# Patient Record
Sex: Female | Born: 1950 | Race: White | Hispanic: No | Marital: Married | Smoking: Never smoker
Health system: Southern US, Community
[De-identification: ages and names within clinical notes are randomized; demographics above are authoritative.]

## PROBLEM LIST (undated history)

## (undated) DIAGNOSIS — K269 Duodenal ulcer, unspecified as acute or chronic, without hemorrhage or perforation: Secondary | ICD-10-CM

## (undated) DIAGNOSIS — T7840XA Allergy, unspecified, initial encounter: Secondary | ICD-10-CM

## (undated) DIAGNOSIS — K5730 Diverticulosis of large intestine without perforation or abscess without bleeding: Secondary | ICD-10-CM

## (undated) DIAGNOSIS — K219 Gastro-esophageal reflux disease without esophagitis: Secondary | ICD-10-CM

## (undated) DIAGNOSIS — K649 Unspecified hemorrhoids: Secondary | ICD-10-CM

## (undated) DIAGNOSIS — E785 Hyperlipidemia, unspecified: Secondary | ICD-10-CM

## (undated) DIAGNOSIS — M1990 Unspecified osteoarthritis, unspecified site: Secondary | ICD-10-CM

## (undated) DIAGNOSIS — I10 Essential (primary) hypertension: Secondary | ICD-10-CM

## (undated) HISTORY — PX: WISDOM TOOTH EXTRACTION: SHX21

## (undated) HISTORY — PX: HEMORRHOID BANDING: SHX5850

## (undated) HISTORY — PX: OTHER SURGICAL HISTORY: SHX169

## (undated) HISTORY — DX: Hyperlipidemia, unspecified: E78.5

## (undated) HISTORY — DX: Allergy, unspecified, initial encounter: T78.40XA

## (undated) HISTORY — DX: Diverticulosis of large intestine without perforation or abscess without bleeding: K57.30

## (undated) HISTORY — PX: UPPER GI ENDOSCOPY: SHX6162

## (undated) HISTORY — PX: COLONOSCOPY: SHX174

## (undated) HISTORY — DX: Essential (primary) hypertension: I10

## (undated) HISTORY — DX: Gastro-esophageal reflux disease without esophagitis: K21.9

## (undated) HISTORY — DX: Duodenal ulcer, unspecified as acute or chronic, without hemorrhage or perforation: K26.9

## (undated) HISTORY — PX: TUBAL LIGATION: SHX77

## (undated) HISTORY — DX: Unspecified osteoarthritis, unspecified site: M19.90

## (undated) HISTORY — DX: Unspecified hemorrhoids: K64.9

---

## 1997-10-24 ENCOUNTER — Other Ambulatory Visit: Admission: RE | Admit: 1997-10-24 | Discharge: 1997-10-24 | Payer: Self-pay | Admitting: Obstetrics and Gynecology

## 1997-10-27 ENCOUNTER — Other Ambulatory Visit: Admission: RE | Admit: 1997-10-27 | Discharge: 1997-10-27 | Payer: Self-pay | Admitting: Obstetrics and Gynecology

## 1998-10-26 ENCOUNTER — Other Ambulatory Visit: Admission: RE | Admit: 1998-10-26 | Discharge: 1998-10-26 | Payer: Self-pay | Admitting: Obstetrics and Gynecology

## 1999-04-26 ENCOUNTER — Other Ambulatory Visit: Admission: RE | Admit: 1999-04-26 | Discharge: 1999-04-26 | Payer: Self-pay | Admitting: Obstetrics and Gynecology

## 1999-04-26 ENCOUNTER — Encounter (INDEPENDENT_AMBULATORY_CARE_PROVIDER_SITE_OTHER): Payer: Self-pay

## 1999-07-26 ENCOUNTER — Encounter: Payer: Self-pay | Admitting: General Surgery

## 1999-07-26 ENCOUNTER — Encounter: Admission: RE | Admit: 1999-07-26 | Discharge: 1999-07-26 | Payer: Self-pay | Admitting: General Surgery

## 1999-11-08 ENCOUNTER — Other Ambulatory Visit: Admission: RE | Admit: 1999-11-08 | Discharge: 1999-11-08 | Payer: Self-pay | Admitting: Obstetrics and Gynecology

## 1999-11-26 ENCOUNTER — Encounter: Admission: RE | Admit: 1999-11-26 | Discharge: 1999-11-26 | Payer: Self-pay | Admitting: Obstetrics and Gynecology

## 1999-11-26 ENCOUNTER — Encounter: Payer: Self-pay | Admitting: Obstetrics and Gynecology

## 2000-11-13 ENCOUNTER — Other Ambulatory Visit: Admission: RE | Admit: 2000-11-13 | Discharge: 2000-11-13 | Payer: Self-pay | Admitting: Obstetrics and Gynecology

## 2000-12-21 ENCOUNTER — Encounter: Payer: Self-pay | Admitting: Obstetrics and Gynecology

## 2000-12-21 ENCOUNTER — Encounter: Admission: RE | Admit: 2000-12-21 | Discharge: 2000-12-21 | Payer: Self-pay | Admitting: Obstetrics and Gynecology

## 2000-12-26 ENCOUNTER — Encounter: Admission: RE | Admit: 2000-12-26 | Discharge: 2000-12-26 | Payer: Self-pay | Admitting: Obstetrics and Gynecology

## 2000-12-26 ENCOUNTER — Encounter: Payer: Self-pay | Admitting: Obstetrics and Gynecology

## 2002-01-02 ENCOUNTER — Encounter: Payer: Self-pay | Admitting: Obstetrics and Gynecology

## 2002-01-02 ENCOUNTER — Encounter: Admission: RE | Admit: 2002-01-02 | Discharge: 2002-01-02 | Payer: Self-pay | Admitting: Obstetrics and Gynecology

## 2003-02-03 ENCOUNTER — Encounter: Admission: RE | Admit: 2003-02-03 | Discharge: 2003-02-03 | Payer: Self-pay | Admitting: Unknown Physician Specialty

## 2003-02-03 ENCOUNTER — Encounter: Payer: Self-pay | Admitting: Unknown Physician Specialty

## 2004-02-06 ENCOUNTER — Encounter: Admission: RE | Admit: 2004-02-06 | Discharge: 2004-02-06 | Payer: Self-pay | Admitting: Obstetrics and Gynecology

## 2004-07-19 ENCOUNTER — Ambulatory Visit: Payer: Self-pay | Admitting: Family Medicine

## 2005-01-17 ENCOUNTER — Ambulatory Visit: Payer: Self-pay | Admitting: Family Medicine

## 2005-02-15 ENCOUNTER — Encounter: Admission: RE | Admit: 2005-02-15 | Discharge: 2005-02-15 | Payer: Self-pay | Admitting: Obstetrics and Gynecology

## 2005-07-18 ENCOUNTER — Ambulatory Visit: Payer: Self-pay | Admitting: Family Medicine

## 2006-01-24 ENCOUNTER — Ambulatory Visit: Payer: Self-pay | Admitting: Family Medicine

## 2006-06-20 ENCOUNTER — Encounter: Admission: RE | Admit: 2006-06-20 | Discharge: 2006-06-20 | Payer: Self-pay | Admitting: Radiology

## 2007-06-21 ENCOUNTER — Encounter: Admission: RE | Admit: 2007-06-21 | Discharge: 2007-06-21 | Payer: Self-pay | Admitting: Obstetrics and Gynecology

## 2008-06-23 ENCOUNTER — Encounter: Admission: RE | Admit: 2008-06-23 | Discharge: 2008-06-23 | Payer: Self-pay | Admitting: Obstetrics and Gynecology

## 2009-06-29 ENCOUNTER — Encounter: Admission: RE | Admit: 2009-06-29 | Discharge: 2009-06-29 | Payer: Self-pay | Admitting: Obstetrics and Gynecology

## 2010-05-26 ENCOUNTER — Other Ambulatory Visit: Payer: Self-pay | Admitting: Obstetrics and Gynecology

## 2010-05-26 DIAGNOSIS — Z1231 Encounter for screening mammogram for malignant neoplasm of breast: Secondary | ICD-10-CM

## 2010-07-05 ENCOUNTER — Ambulatory Visit
Admission: RE | Admit: 2010-07-05 | Discharge: 2010-07-05 | Disposition: A | Discharge status: Home / Self Care | Payer: BC Managed Care – PPO | Source: Ambulatory Visit | Attending: Obstetrics and Gynecology | Admitting: Obstetrics and Gynecology

## 2010-07-05 DIAGNOSIS — Z1231 Encounter for screening mammogram for malignant neoplasm of breast: Secondary | ICD-10-CM

## 2011-05-31 ENCOUNTER — Other Ambulatory Visit: Payer: Self-pay | Admitting: Obstetrics and Gynecology

## 2011-05-31 DIAGNOSIS — Z1231 Encounter for screening mammogram for malignant neoplasm of breast: Secondary | ICD-10-CM

## 2011-07-15 ENCOUNTER — Ambulatory Visit
Admission: RE | Admit: 2011-07-15 | Discharge: 2011-07-15 | Disposition: A | Discharge status: Home / Self Care | Payer: BC Managed Care – PPO | Source: Ambulatory Visit | Attending: Obstetrics and Gynecology | Admitting: Obstetrics and Gynecology

## 2011-07-15 DIAGNOSIS — Z1231 Encounter for screening mammogram for malignant neoplasm of breast: Secondary | ICD-10-CM

## 2012-06-06 ENCOUNTER — Other Ambulatory Visit: Payer: Self-pay | Admitting: Obstetrics and Gynecology

## 2012-07-16 ENCOUNTER — Ambulatory Visit
Admission: RE | Admit: 2012-07-16 | Discharge: 2012-07-16 | Disposition: A | Discharge status: Home / Self Care | Payer: BC Managed Care – PPO | Source: Ambulatory Visit | Attending: Obstetrics and Gynecology | Admitting: Obstetrics and Gynecology

## 2012-07-16 DIAGNOSIS — Z1231 Encounter for screening mammogram for malignant neoplasm of breast: Secondary | ICD-10-CM

## 2013-06-17 ENCOUNTER — Other Ambulatory Visit: Payer: Self-pay

## 2013-06-17 DIAGNOSIS — Z1231 Encounter for screening mammogram for malignant neoplasm of breast: Secondary | ICD-10-CM

## 2013-07-22 ENCOUNTER — Ambulatory Visit
Admission: RE | Admit: 2013-07-22 | Discharge: 2013-07-22 | Disposition: A | Discharge status: Home / Self Care | Payer: BC Managed Care – PPO | Source: Ambulatory Visit

## 2013-07-22 DIAGNOSIS — Z1231 Encounter for screening mammogram for malignant neoplasm of breast: Secondary | ICD-10-CM

## 2014-06-16 ENCOUNTER — Other Ambulatory Visit: Payer: Self-pay

## 2014-06-16 DIAGNOSIS — Z1231 Encounter for screening mammogram for malignant neoplasm of breast: Secondary | ICD-10-CM

## 2014-07-28 ENCOUNTER — Ambulatory Visit: Payer: BC Managed Care – PPO

## 2014-08-25 ENCOUNTER — Encounter (INDEPENDENT_AMBULATORY_CARE_PROVIDER_SITE_OTHER): Payer: Self-pay

## 2014-08-25 ENCOUNTER — Ambulatory Visit
Admission: RE | Admit: 2014-08-25 | Discharge: 2014-08-25 | Disposition: A | Discharge status: Home / Self Care | Payer: BC Managed Care – PPO | Source: Ambulatory Visit

## 2014-08-25 DIAGNOSIS — Z1231 Encounter for screening mammogram for malignant neoplasm of breast: Secondary | ICD-10-CM

## 2015-07-21 ENCOUNTER — Other Ambulatory Visit: Payer: Self-pay

## 2015-07-21 DIAGNOSIS — Z1231 Encounter for screening mammogram for malignant neoplasm of breast: Secondary | ICD-10-CM

## 2015-08-31 ENCOUNTER — Ambulatory Visit
Admission: RE | Admit: 2015-08-31 | Discharge: 2015-08-31 | Disposition: A | Discharge status: Home / Self Care | Payer: Medicare Other | Source: Ambulatory Visit

## 2015-08-31 DIAGNOSIS — Z1231 Encounter for screening mammogram for malignant neoplasm of breast: Secondary | ICD-10-CM

## 2016-07-20 ENCOUNTER — Other Ambulatory Visit: Payer: Self-pay | Admitting: Obstetrics and Gynecology

## 2016-07-20 DIAGNOSIS — Z1231 Encounter for screening mammogram for malignant neoplasm of breast: Secondary | ICD-10-CM

## 2016-09-05 ENCOUNTER — Ambulatory Visit
Admission: RE | Admit: 2016-09-05 | Discharge: 2016-09-05 | Disposition: A | Discharge status: Home / Self Care | Payer: Medicare Other | Source: Ambulatory Visit | Attending: Obstetrics and Gynecology | Admitting: Obstetrics and Gynecology

## 2016-09-05 DIAGNOSIS — Z1231 Encounter for screening mammogram for malignant neoplasm of breast: Secondary | ICD-10-CM

## 2016-10-24 ENCOUNTER — Encounter: Payer: Self-pay | Admitting: Gastroenterology

## 2016-12-07 ENCOUNTER — Ambulatory Visit (AMBULATORY_SURGERY_CENTER): Payer: Self-pay

## 2016-12-07 VITALS — Ht 65.0 in | Wt 156.4 lb

## 2016-12-07 DIAGNOSIS — Z1211 Encounter for screening for malignant neoplasm of colon: Secondary | ICD-10-CM

## 2016-12-07 MED ORDER — NA SULFATE-K SULFATE-MG SULF 17.5-3.13-1.6 GM/177ML PO SOLN: 1.0000 | Freq: Once | ORAL | 0 refills | Status: AC

## 2016-12-07 NOTE — Progress Notes (Signed)
Denies allergies to eggs or soy products. Denies complication of anesthesia or sedation. Denies use of weight loss medication. Denies use of O2.   Emmi instructions declined. No computer. 

## 2016-12-21 ENCOUNTER — Ambulatory Visit (AMBULATORY_SURGERY_CENTER): Payer: Medicare Other | Admitting: Gastroenterology

## 2016-12-21 ENCOUNTER — Encounter: Payer: Self-pay | Admitting: Gastroenterology

## 2016-12-21 VITALS — BP 130/73 | HR 62 | Temp 96.0°F | Resp 11 | Ht 65.0 in | Wt 156.0 lb

## 2016-12-21 DIAGNOSIS — Z1211 Encounter for screening for malignant neoplasm of colon: Secondary | ICD-10-CM

## 2016-12-21 DIAGNOSIS — Z1212 Encounter for screening for malignant neoplasm of rectum: Secondary | ICD-10-CM | POA: Diagnosis not present

## 2016-12-21 MED ORDER — SODIUM CHLORIDE 0.9 % IV SOLN: 500.0000 mL | INTRAVENOUS | Status: DC

## 2016-12-21 NOTE — Progress Notes (Signed)
A and O x3. Report to RN. Tolerated MAC anesthesia well.

## 2016-12-21 NOTE — Patient Instructions (Signed)
Discharge instructions given. Handouts on diverticulosis and hemorrhoids. Resume previous medications. YOU HAD AN ENDOSCOPIC PROCEDURE TODAY AT THE [facility]:   Refer to the procedure report that was given to you for any specific questions about what was found during the examination.  If the procedure report does not answer your questions, please call your gastroenterologist to clarify.  If you requested that your care partner not be given the details of your procedure findings, then the procedure report has been included in a sealed envelope for you to review at your convenience later.  YOU SHOULD EXPECT: Some feelings of bloating in the abdomen. Passage of more gas than usual.  Walking can help get rid of the air that was put into your GI tract during the procedure and reduce the bloating. If you had a lower endoscopy (such as a colonoscopy or flexible sigmoidoscopy) you may notice spotting of blood in your stool or on the toilet paper. If you underwent a bowel prep for your procedure, you may not have a normal bowel movement for a few days.  Please Note:  You might notice some irritation and congestion in your nose or some drainage.  This is from the oxygen used during your procedure.  There is no need for concern and it should clear up in a day or so.  SYMPTOMS TO REPORT IMMEDIATELY:   Following lower endoscopy (colonoscopy or flexible sigmoidoscopy):  Excessive amounts of blood in the stool  Significant tenderness or worsening of abdominal pains  Swelling of the abdomen that is new, acute  Fever of 100F or higher   For urgent or emergent issues, a gastroenterologist can be reached at any hour by calling [phone].   DIET:  We do recommend a small meal at first, but then you may proceed to your regular diet.  Drink plenty of fluids but you should avoid alcoholic beverages for 24 hours.  ACTIVITY:  You should plan to take it easy for the rest of today and you should  NOT DRIVE or use heavy machinery until tomorrow (because of the sedation medicines used during the test).    FOLLOW UP: Our staff will call the number listed on your records the next business day following your procedure to check on you and address any questions or concerns that you may have regarding the information given to you following your procedure. If we do not reach you, we will leave a message.  However, if you are feeling well and you are not experiencing any problems, there is no need to return our call.  We will assume that you have returned to your regular daily activities without incident.  If any biopsies were taken you will be contacted by phone or by letter within the next 1-3 weeks.  Please call us at [phone] if you have not heard about the biopsies in 3 weeks.    SIGNATURES/CONFIDENTIALITY: You and/or your care partner have signed paperwork which will be entered into your electronic medical record.  These signatures attest to the fact that that the information above on your After Visit Summary has been reviewed and is understood.  Full responsibility of the confidentiality of this discharge information lies with you and/or your care-partner. 

## 2016-12-21 NOTE — Op Note (Signed)
[facility] Patient Name: Gloria Moore Procedure Date: 12/21/2016 10:24 AM MRN: [id number] Endoscopist: Viviann SpareSteven P. Natacha Jepsen MD, MD Age: [age] Referring MD:  Date of Birth: 26-Oct-1950 Gender: Female Account #: [id number] Procedure:                Colonoscopy Indications:              Screening for colorectal malignant neoplasm Medicines:                Monitored Anesthesia Care Procedure:                Pre-Anesthesia Assessment:                           - Prior to the procedure, a History and Physical                            was performed, and patient medications and                            allergies were reviewed. The patient's tolerance of                            previous anesthesia was also reviewed. The risks                            and benefits of the procedure and the sedation                            options and risks were discussed with the patient.                            All questions were answered, and informed consent                            was obtained. Prior Anticoagulants: The patient has                            taken no previous anticoagulant or antiplatelet                            agents. ASA Grade Assessment: II - A patient with                            mild systemic disease. After reviewing the risks                            and benefits, the patient was deemed in                            satisfactory condition to undergo the procedure.                           After obtaining informed consent, the colonoscope  was passed under direct vision. Throughout the                            procedure, the patient's blood pressure, pulse, and                            oxygen saturations were monitored continuously. The                            Colonoscope was introduced through the anus and                            advanced to the the cecum, identified by                            appendiceal orifice  and ileocecal valve. The                            colonoscopy was performed without difficulty. The                            patient tolerated the procedure well. The quality                            of the bowel preparation was good. The ileocecal                            valve, appendiceal orifice, and rectum were                            photographed. Scope In: 10:32:40 AM Scope Out: 10:53:05 AM Scope Withdrawal Time: 0 hours 17 minutes 27 seconds  Total Procedure Duration: 0 hours 20 minutes 25 seconds  Findings:                 The perianal and digital rectal examinations were                            normal other than small external hemorrhoids                           A few medium-mouthed diverticula were found in the                            sigmoid colon.                           Internal hemorrhoids were found during retroflexion.                           The hepatic flexure was quite angulated with                            difficult visualization, although multiple passes  made with the colonoscope in this area and no                            abnormalities noted. The exam was otherwise without                            abnormality. Complications:            No immediate complications. Estimated blood loss:                            None. Estimated Blood Loss:     Estimated blood loss: none. Impression:               - Small external hemorrhoids                           - Diverticulosis in the sigmoid colon.                           - Internal hemorrhoids.                           - The examination was otherwise normal.                           - No specimens collected. Recommendation:           - Patient has a contact number available for                            emergencies. The signs and symptoms of potential                            delayed complications were discussed with the                            patient.  Return to normal activities tomorrow.                            Written discharge instructions were provided to the                            patient.                           - Resume previous diet.                           - Continue present medications.                           - Repeat colonoscopy in 10 years for screening                            purposes if you still wish to have screening at  that time, depending on multiple factors. You can                            see me in clinic at that time to determine if you                            wish to have further screening Sharronda Schweers P. Exander Shaul MD, MD 12/21/2016 10:58:22 AM This report has been signed electronically.

## 2016-12-22 ENCOUNTER — Telehealth: Payer: Self-pay | Admitting: Gastroenterology

## 2016-12-22 ENCOUNTER — Telehealth: Payer: Self-pay

## 2016-12-22 NOTE — Telephone Encounter (Signed)
  Follow up Call-  Call back number 12/21/2016  Post procedure Call Back phone  # [phone]  Permission to leave phone message Yes  Some recent data might be hidden     Patient questions:  Do you have a fever, pain , or abdominal swelling? No. Pain Score  0 *  Have you tolerated food without any problems? Yes.    Have you been able to return to your normal activities? Yes.    Do you have any questions about your discharge instructions: Diet   No. Medications  No. Follow up visit  No.  Do you have questions or concerns about your Care? No.  Actions: * If pain score is 4 or above: No action needed, pain <4.

## 2016-12-22 NOTE — Telephone Encounter (Signed)
Pt scheduled for banding #1 12/28/16@4pm . Pt aware of appt.

## 2016-12-22 NOTE — Telephone Encounter (Signed)
Dr. Adela LankArmbruster please advise if ok to schedule pt for Hem Banding.

## 2016-12-22 NOTE — Telephone Encounter (Signed)
Yes if she is having symptoms from hemorrhoids, noted on colonoscopy yesterday, we can schedule her for a routine banding. Thanks

## 2016-12-28 ENCOUNTER — Ambulatory Visit (INDEPENDENT_AMBULATORY_CARE_PROVIDER_SITE_OTHER): Payer: Medicare Other | Admitting: Gastroenterology

## 2016-12-28 ENCOUNTER — Encounter: Payer: Self-pay | Admitting: Gastroenterology

## 2016-12-28 VITALS — BP 124/76 | HR 60 | Ht 65.0 in | Wt 157.8 lb

## 2016-12-28 DIAGNOSIS — K648 Other hemorrhoids: Secondary | ICD-10-CM

## 2016-12-28 NOTE — Progress Notes (Signed)
Patient here for band ligation of hemorrhoids. Main symptoms are irritation, rare bleeding. She wishes to proceed with banding following discussion of risks / benefits.     PROCEDURE NOTE: The patient presents with symptomatic grade II  hemorrhoids, requesting rubber band ligation of his/her hemorrhoidal disease.  All risks, benefits and alternative forms of therapy were described and informed consent was obtained.  In the Left Lateral Decubitus position anoscopic examination revealed grade II hemorrhoids in all position(s).  The anorectum was pre-medicated with 0.125% nitroglycerin The decision was made to band the RP internal hemorrhoid, and the [facility] O'Regan System was used to perform band ligation without complication.  Digital anorectal examination was then performed to assure proper positioning of the band, and to adjust the banded tissue as required.  The patient was discharged home without pain or other issues.  Dietary and behavioral recommendations were given and along with follow-up instructions.      The patient will return in 2-4 weeks for  follow-up and possible additional banding as required. No complications were encountered and the patient tolerated the procedure well.  Gloria PatrickSteven Karlisa Gaubert, MD [facility] Pager [phone]

## 2016-12-28 NOTE — Patient Instructions (Addendum)
HEMORRHOID BANDING PROCEDURE    FOLLOW-UP CARE   1. The procedure you have had should have been relatively painless since the banding of the area involved does not have nerve endings and there is no pain sensation.  The rubber band cuts off the blood supply to the hemorrhoid and the band may fall off as soon as 48 hours after the banding (the band may occasionally be seen in the toilet bowl following a bowel movement). You may notice a temporary feeling of fullness in the rectum which should respond adequately to plain Tylenol or Motrin.  2. Following the banding, avoid strenuous exercise that evening and resume full activity the next day.  A sitz bath (soaking in a warm tub) or bidet is soothing, and can be useful for cleansing the area after bowel movements.     3. To avoid constipation, take two tablespoons of natural wheat bran, natural oat bran, flax, Benefiber or any over the counter fiber supplement and increase your water intake to 7-8 glasses daily.    4. Unless you have been prescribed anorectal medication, do not put anything inside your rectum for two weeks: No suppositories, enemas, fingers, etc.  5. Occasionally, you may have more bleeding than usual after the banding procedure.  This is often from the untreated hemorrhoids rather than the treated one.  Don't be concerned if there is a tablespoon or so of blood.  If there is more blood than this, lie flat with your bottom higher than your head and apply an ice pack to the area. If the bleeding does not stop within a half an hour or if you feel faint, call our office at [phone] or go to the emergency room.  6. Problems are not common; however, if there is a substantial amount of bleeding, severe pain, chills, fever or difficulty passing urine (very rare) or other problems, you should call us at [phone] or report to the nearest emergency room.  7. Do not stay seated continuously for more than 2-3 hours for a day or two  after the procedure.  Tighten your buttock muscles 10-15 times every two hours and take 10-15 deep breaths every 1-2 hours.  Do not spend more than a few minutes on the toilet if you cannot empty your bowel; instead re-visit the toilet at a later time.   We have scheduled a 2nd banding appointment for 01-23-17 at 4:00pm.  We have scheduled a 3rd banding appointment for 02-14-17 at 4:00pm.  If you are age 68 or older, your body mass index should be between 23-30. Your Body mass index is 26.26 kg/m. If this is out of the aforementioned range listed, please consider follow up with your Primary Care Provider.  If you are age 68 or younger, your body mass index should be between 19-25. Your Body mass index is 26.26 kg/m. If this is out of the aformentioned range listed, please consider follow up with your Primary Care Provider.

## 2017-01-23 ENCOUNTER — Encounter (INDEPENDENT_AMBULATORY_CARE_PROVIDER_SITE_OTHER): Payer: Self-pay

## 2017-01-23 ENCOUNTER — Ambulatory Visit (INDEPENDENT_AMBULATORY_CARE_PROVIDER_SITE_OTHER): Payer: Medicare Other | Admitting: Gastroenterology

## 2017-01-23 ENCOUNTER — Encounter: Payer: Self-pay | Admitting: Gastroenterology

## 2017-01-23 VITALS — BP 130/74 | HR 60 | Ht 65.0 in | Wt 156.0 lb

## 2017-01-23 DIAGNOSIS — K641 Second degree hemorrhoids: Secondary | ICD-10-CM | POA: Diagnosis not present

## 2017-01-23 DIAGNOSIS — K648 Other hemorrhoids: Secondary | ICD-10-CM

## 2017-01-23 NOTE — Patient Instructions (Signed)
If you are age 68 or older, your body mass index should be between 23-30. Your Body mass index is 25.96 kg/m. If this is out of the aforementioned range listed, please consider follow up with your Primary Care Provider.  If you are age 47 or younger, your body mass index should be between 19-25. Your Body mass index is 25.96 kg/m. If this is out of the aformentioned range listed, please consider follow up with your Primary Care Provider.   HEMORRHOID BANDING PROCEDURE    FOLLOW-UP CARE   1. The procedure you have had should have been relatively painless since the banding of the area involved does not have nerve endings and there is no pain sensation.  The rubber band cuts off the blood supply to the hemorrhoid and the band may fall off as soon as 48 hours after the banding (the band may occasionally be seen in the toilet bowl following a bowel movement). You may notice a temporary feeling of fullness in the rectum which should respond adequately to plain Tylenol or Motrin.  2. Following the banding, avoid strenuous exercise that evening and resume full activity the next day.  A sitz bath (soaking in a warm tub) or bidet is soothing, and can be useful for cleansing the area after bowel movements.     3. To avoid constipation, take two tablespoons of natural wheat bran, natural oat bran, flax, Benefiber or any over the counter fiber supplement and increase your water intake to 7-8 glasses daily.    4. Unless you have been prescribed anorectal medication, do not put anything inside your rectum for two weeks: No suppositories, enemas, fingers, etc.  5. Occasionally, you may have more bleeding than usual after the banding procedure.  This is often from the untreated hemorrhoids rather than the treated one.  Don't be concerned if there is a tablespoon or so of blood.  If there is more blood than this, lie flat with your bottom higher than your head and apply an ice pack to the area. If the bleeding  does not stop within a half an hour or if you feel faint, call our office at [phone] or go to the emergency room.  6. Problems are not common; however, if there is a substantial amount of bleeding, severe pain, chills, fever or difficulty passing urine (very rare) or other problems, you should call us at [phone] or report to the nearest emergency room.  7. Do not stay seated continuously for more than 2-3 hours for a day or two after the procedure.  Tighten your buttock muscles 10-15 times every two hours and take 10-15 deep breaths every 1-2 hours.  Do not spend more than a few minutes on the toilet if you cannot empty your bowel; instead re-visit the toilet at a later time.   Follow up appointment with Dr. Adela Lank on 02/14/17 at 4:00 pm.  Thank you for choosing me and [facility].   Ileene Patrick, MD

## 2017-01-23 NOTE — Progress Notes (Signed)
PAtient here for follow up. She did well with the first banding, she has had improvement in her symptoms. Here for second banding today.    PROCEDURE NOTE: The patient presents with symptomatic grade II  hemorrhoids, requesting rubber band ligation of his/her hemorrhoidal disease.  All risks, benefits and alternative forms of therapy were described and informed consent was obtained.  The anorectum was pre-medicated with 0.125% nitroglycerin The decision was made to band the LL internal hemorrhoid, and the [facility] O'Regan System was used to perform band ligation without complication.  Digital anorectal examination was then performed to assure proper positioning of the band, and to adjust the banded tissue as required.  The patient was discharged home without pain or other issues.  Dietary and behavioral recommendations were given and along with follow-up instructions.     The patient will return in 2-4 weeks for  follow-up and possible additional banding as required. No complications were encountered and the patient tolerated the procedure well.  Gloria Patrick, MD [facility] Pager [phone]

## 2017-02-14 ENCOUNTER — Ambulatory Visit (INDEPENDENT_AMBULATORY_CARE_PROVIDER_SITE_OTHER): Payer: Medicare Other | Admitting: Gastroenterology

## 2017-02-14 ENCOUNTER — Encounter: Payer: Self-pay | Admitting: Gastroenterology

## 2017-02-14 VITALS — BP 120/78 | HR 72 | Ht 66.0 in | Wt 156.0 lb

## 2017-02-14 DIAGNOSIS — K648 Other hemorrhoids: Secondary | ICD-10-CM | POA: Diagnosis not present

## 2017-02-14 NOTE — Progress Notes (Signed)
Patient has had a nice response to her first 2 hemorrhoid bandings. Symptoms have resolved. She is here for her final banding treatment.   PROCEDURE NOTE: The patient presents with symptomatic grade II  hemorrhoids, requesting rubber band ligation of his/her hemorrhoidal disease.  All risks, benefits and alternative forms of therapy were described and informed consent was obtained.  The anorectum was pre-medicated with 0.125% nitroglycerin The decision was made to band the RA internal hemorrhoid, and the [facility] O'Regan System was used to perform band ligation without complication.  Digital anorectal examination was then performed to assure proper positioning of the band, and to adjust the banded tissue as required.  The patient was discharged home without pain or other issues.  Dietary and behavioral recommendations were given and along with follow-up instructions.     The patient will follow up as needed moving forward No complications were encountered and the patient tolerated the procedure well.  Ileene PatrickSteven Leshia Kope, MD [facility] Pager [phone]

## 2017-02-14 NOTE — Patient Instructions (Addendum)
HEMORRHOID BANDING PROCEDURE    FOLLOW-UP CARE   1. The procedure you have had should have been relatively painless since the banding of the area involved does not have nerve endings and there is no pain sensation.  The rubber band cuts off the blood supply to the hemorrhoid and the band may fall off as soon as 48 hours after the banding (the band may occasionally be seen in the toilet bowl following a bowel movement). You may notice a temporary feeling of fullness in the rectum which should respond adequately to plain Tylenol or Motrin.  2. Following the banding, avoid strenuous exercise that evening and resume full activity the next day.  A sitz bath (soaking in a warm tub) or bidet is soothing, and can be useful for cleansing the area after bowel movements.     3. To avoid constipation, take two tablespoons of natural wheat bran, natural oat bran, flax, Benefiber or any over the counter fiber supplement and increase your water intake to 7-8 glasses daily.    4. Unless you have been prescribed anorectal medication, do not put anything inside your rectum for two weeks: No suppositories, enemas, fingers, etc.  5. Occasionally, you may have more bleeding than usual after the banding procedure.  This is often from the untreated hemorrhoids rather than the treated one.  Don't be concerned if there is a tablespoon or so of blood.  If there is more blood than this, lie flat with your bottom higher than your head and apply an ice pack to the area. If the bleeding does not stop within a half an hour or if you feel faint, call our office at [phone] or go to the emergency room.  6. Problems are not common; however, if there is a substantial amount of bleeding, severe pain, chills, fever or difficulty passing urine (very rare) or other problems, you should call us at [phone] or report to the nearest emergency room.  7. Do not stay seated continuously for more than 2-3 hours for a day or two  after the procedure.  Tighten your buttock muscles 10-15 times every two hours and take 10-15 deep breaths every 1-2 hours.  Do not spend more than a few minutes on the toilet if you cannot empty your bowel; instead re-visit the toilet at a later time.  Follow up as needed with Dr Adela LankArmbruster.  I appreciate the opportunity to care for you.

## 2017-07-27 ENCOUNTER — Other Ambulatory Visit: Payer: Self-pay | Admitting: Obstetrics and Gynecology

## 2017-07-27 DIAGNOSIS — Z1231 Encounter for screening mammogram for malignant neoplasm of breast: Secondary | ICD-10-CM

## 2017-09-18 ENCOUNTER — Ambulatory Visit
Admission: RE | Admit: 2017-09-18 | Discharge: 2017-09-18 | Disposition: A | Discharge status: Home / Self Care | Payer: Medicare Other | Source: Ambulatory Visit | Attending: Obstetrics and Gynecology | Admitting: Obstetrics and Gynecology

## 2017-09-18 DIAGNOSIS — Z1231 Encounter for screening mammogram for malignant neoplasm of breast: Secondary | ICD-10-CM

## 2018-08-31 IMAGING — MG DIGITAL SCREENING BILATERAL MAMMOGRAM WITH CAD
5 series · 5 of 5 positions shown · non-contrast
Comparison: Previous exam(s).

CLINICAL DATA: Screening.

EXAM:
DIGITAL SCREENING BILATERAL MAMMOGRAM WITH CAD

[R MLO (1 of 2)]
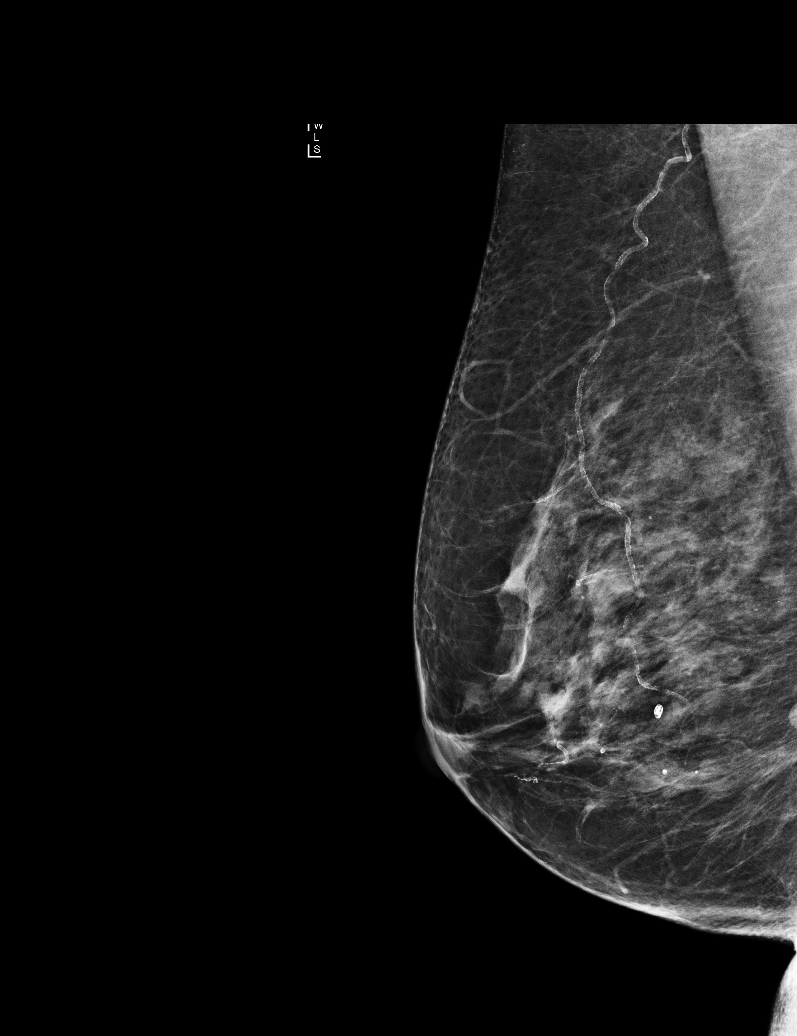

[L CC]
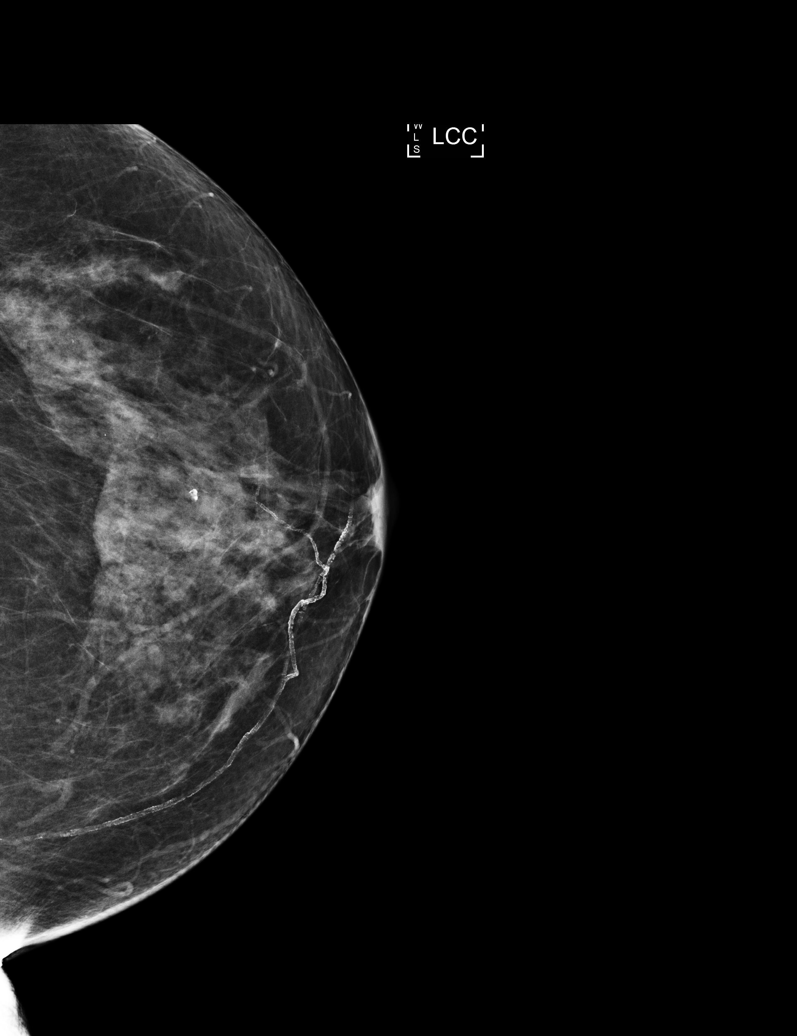

[L MLO]
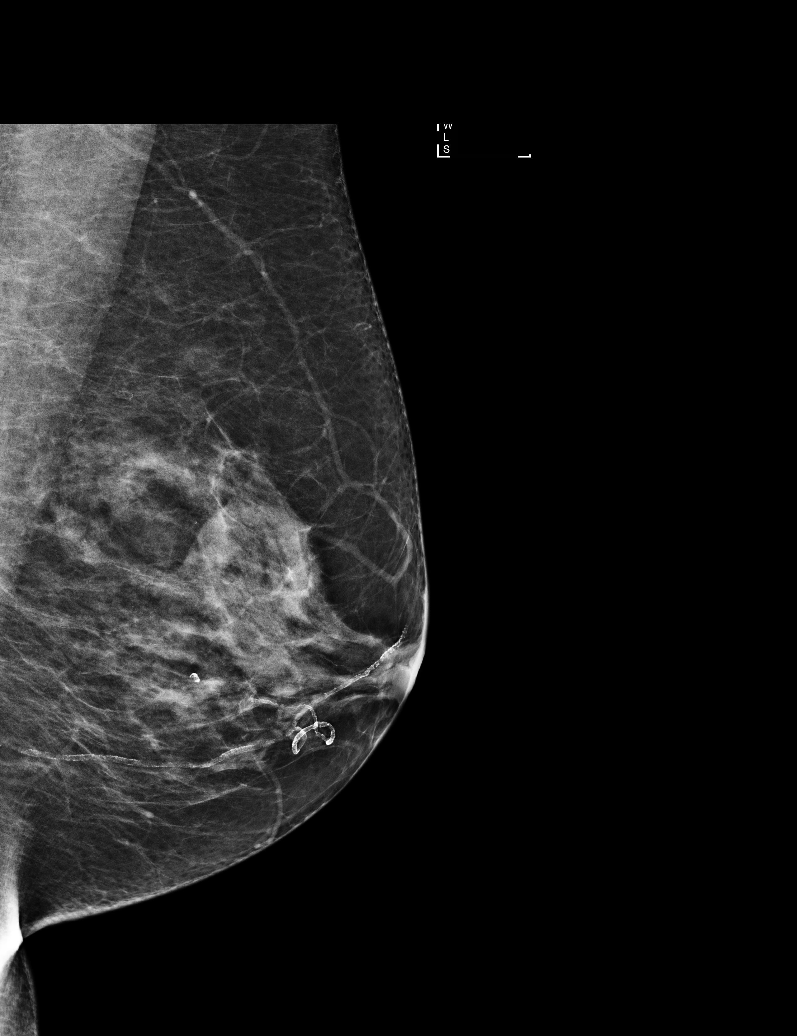

[R MLO (2 of 2)]
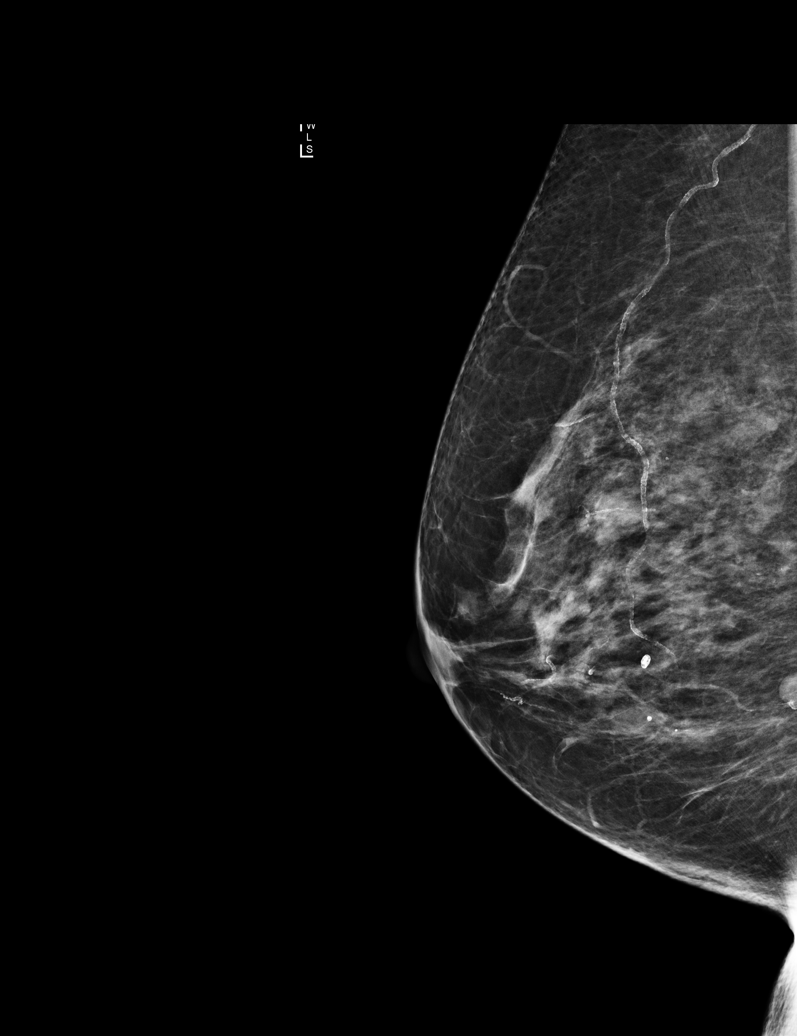

[R CC]
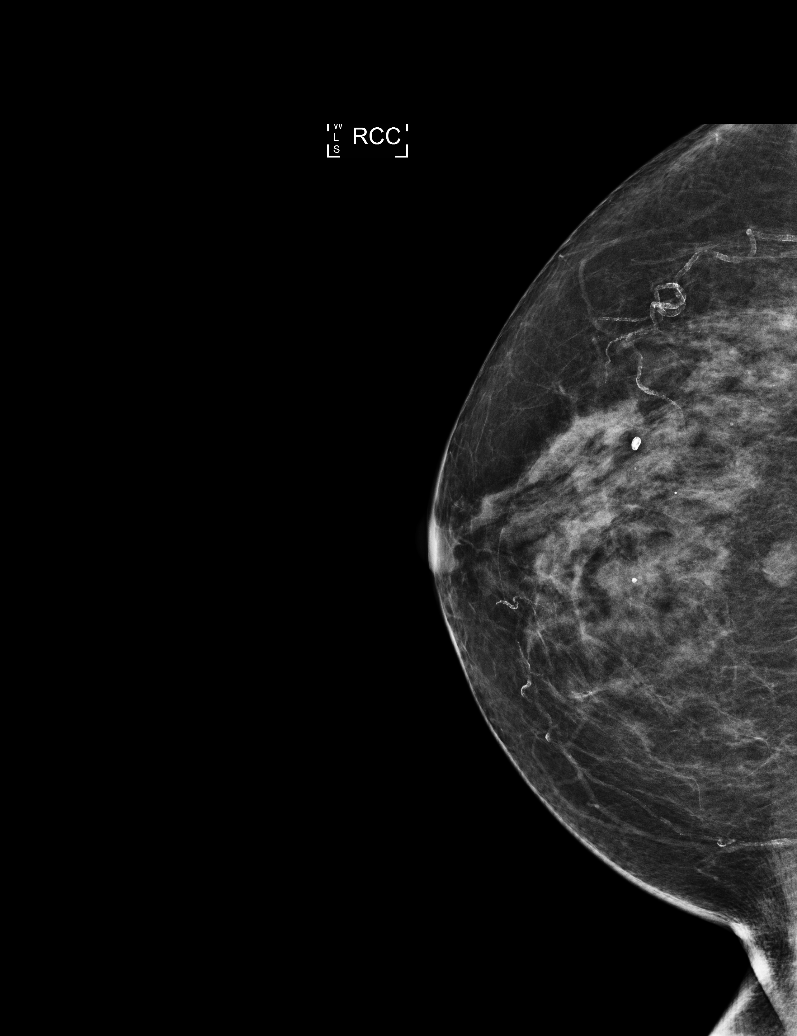

[5 of 5 positions shown; findings below may reference images not displayed]

ACR Breast Density Category c: The breast tissue is heterogeneously
dense, which may obscure small masses.
FINDINGS: There are no findings suspicious for malignancy. Images were
processed with CAD.
IMPRESSION: No mammographic evidence of malignancy. A result letter of this
screening mammogram will be mailed directly to the patient.

RECOMMENDATION:
Screening mammogram in one year. (Code:YJ-[id number])

BI-RADS CATEGORY  1: Negative.

## 2018-09-17 ENCOUNTER — Other Ambulatory Visit: Payer: Self-pay | Admitting: Obstetrics and Gynecology

## 2018-09-17 DIAGNOSIS — Z1231 Encounter for screening mammogram for malignant neoplasm of breast: Secondary | ICD-10-CM

## 2018-10-06 ENCOUNTER — Other Ambulatory Visit: Payer: Self-pay

## 2018-10-06 ENCOUNTER — Ambulatory Visit
Admission: RE | Admit: 2018-10-06 | Discharge: 2018-10-06 | Disposition: A | Discharge status: Home / Self Care | Payer: Medicare Other | Source: Ambulatory Visit | Attending: Obstetrics and Gynecology | Admitting: Obstetrics and Gynecology

## 2018-10-06 DIAGNOSIS — Z1231 Encounter for screening mammogram for malignant neoplasm of breast: Secondary | ICD-10-CM

## 2019-05-20 ENCOUNTER — Telehealth: Payer: Self-pay | Admitting: Gastroenterology

## 2019-05-20 NOTE — Telephone Encounter (Signed)
Called patient back and she states she has been taking Imodium usually twice a week and Tums-2 tabs daily. She wanted to know if this is ok to continue until her office appt. On 06/13/19. I told her Yes.

## 2019-05-20 NOTE — Telephone Encounter (Signed)
Patient is calling wanting to know if it's ok for her to continue with Imodium until her appt. Scheduled for 06/13/19.

## 2019-06-13 ENCOUNTER — Ambulatory Visit (INDEPENDENT_AMBULATORY_CARE_PROVIDER_SITE_OTHER): Payer: Medicare Other | Admitting: Gastroenterology

## 2019-06-13 ENCOUNTER — Other Ambulatory Visit: Payer: Self-pay

## 2019-06-13 ENCOUNTER — Encounter: Payer: Self-pay | Admitting: Gastroenterology

## 2019-06-13 VITALS — BP 120/70 | HR 75 | Temp 98.7°F | Ht 67.0 in | Wt 146.0 lb

## 2019-06-13 DIAGNOSIS — R634 Abnormal weight loss: Secondary | ICD-10-CM

## 2019-06-13 DIAGNOSIS — K219 Gastro-esophageal reflux disease without esophagitis: Secondary | ICD-10-CM

## 2019-06-13 DIAGNOSIS — Z01818 Encounter for other preprocedural examination: Secondary | ICD-10-CM

## 2019-06-13 DIAGNOSIS — R1013 Epigastric pain: Secondary | ICD-10-CM

## 2019-06-13 DIAGNOSIS — Z791 Long term (current) use of non-steroidal anti-inflammatories (NSAID): Secondary | ICD-10-CM | POA: Diagnosis not present

## 2019-06-13 MED ORDER — OMEPRAZOLE 40 MG PO CPDR: 40.0000 mg | DELAYED_RELEASE_CAPSULE | Freq: Every day | ORAL | 3 refills | Status: DC

## 2019-06-13 NOTE — Progress Notes (Signed)
HPI :  69 year old female known to me for prior history of screening colonoscopy and hemorrhoid banding, here for a follow-up visit for abdominal pain and reflux.    She states since January she has been having epigastric discomfort.  She describes it as a burning pain that can be severe at times.  It came on quickly for her and then has essentially persisted since that time.  She has also noticed worsening reflux symptoms and heartburn during this time as well.  She tends to eat okay, in fact eating tends to make her pain a little bit less.  If she fasts the pain seems to get worse.  She has been using Tums intermittently, this definitely helps take away some of her symptoms but very transient.  She is also had hiccups and belching that bothers her.  She is lost about 3 to 5 pounds since symptoms began.  She does take a baby aspirin on top of this takes meloxicam 15 mg once daily for chronic arthritic pain.  She states she has been on this regimen for a few years.  No prior history of gastric ulcers.  She has no family history of gastric or esophageal cancer.  She has not been tried on any PPIs.  She denies any blood in her stools.  No history of melena.  She is quite concerned about underlying pathology causing this.  Underwent hemorrhoid banding in 2018 x 3.  She states this is worked really well and has had no further hemorrhoid symptoms that have bothered her.  Colonoscopy 12/21/2016 -  - A few medium-mouthed diverticula were found in the sigmoid colon. - Internal hemorrhoids were found during retroflexion. - The hepatic flexure was quite angulated with difficult visualization, although multiple passes made with the colonoscope in this area and no abnormalities noted. The exam was otherwise without abnormality.   Past Medical History:  Diagnosis Date  . Allergy   . Arthritis   . Diverticula of colon   . GERD (gastroesophageal reflux disease)   . Hemorrhoids    internal and external  .  Hyperlipidemia   . Hypertension      Past Surgical History:  Procedure Laterality Date  . Bladder Stretched    . TUBAL LIGATION Bilateral   . WISDOM TOOTH EXTRACTION     Family History  Problem Relation Age of Onset  . Dementia Mother   . Heart disease Father   . Other Sister        gallbladder cancer  . Colon polyps Neg Hx   . Esophageal cancer Neg Hx   . Pancreatic cancer Neg Hx   . Rectal cancer Neg Hx   . Stomach cancer Neg Hx    Social History   Tobacco Use  . Smoking status: Never Smoker  . Smokeless tobacco: Never Used  Substance Use Topics  . Alcohol use: No  . Drug use: No   Current Outpatient Medications  Medication Sig Dispense Refill  . aspirin EC 81 MG tablet Take 81 mg by mouth daily.    Marland Kitchen atorvastatin (LIPITOR) 10 MG tablet Take 5 mg by mouth daily. One ten mg tablet cut in half taken daily.     . cetirizine (ZYRTEC) 10 MG tablet Take 10 mg by mouth daily.    . Coenzyme Q10 (COQ-10) 100 MG CAPS Take 300 mg by mouth daily.    Marland Kitchen EPINEPHrine 0.3 mg/0.3 mL IJ SOAJ injection Inject 1 mg into the muscle once.    Marland Kitchen  hydrochlorothiazide (HYDRODIURIL) 25 MG tablet Take 25 mg by mouth daily.    . meloxicam (MOBIC) 7.5 MG tablet Take 7.5 mg by mouth daily.    . montelukast (SINGULAIR) 10 MG tablet Take 10 mg by mouth at bedtime.    Marland Kitchen OVER THE COUNTER MEDICATION Calcium and Vitamin D 600 mg two times daily.    . Probiotic Product (PROBIOTIC-10 ULTIMATE PO) Take by mouth.    . triamcinolone (NASACORT ALLERGY 24HR CHILDREN) 55 MCG/ACT AERO nasal inhaler Place 2 sprays into the nose daily.    . metoprolol succinate (TOPROL-XL) 25 MG 24 hr tablet Take 25 mg by mouth daily.     Current Facility-Administered Medications  Medication Dose Route Frequency Provider Last Rate Last Admin  . 0.9 %  sodium chloride infusion  500 mL Intravenous Continuous Daeshon Grammatico, Willaim Rayas, MD       Allergies  Allergen Reactions  . Bee Venom Hives  . Penicillins Rash  . Sulfa Antibiotics  Rash     Review of Systems: All systems reviewed and negative except where noted in HPI.    No results found.  Physical Exam: BP 120/70   Pulse 75   Temp 98.7 F (37.1 C)   Ht 5\' 7"  (1.702 m)   Wt 146 lb (66.2 kg)   BMI 22.87 kg/m  Constitutional: Pleasant,well-developed, female in no acute distress. HEENT: Normocephalic and atraumatic. Conjunctivae are normal. No scleral icterus. Neck supple.  Cardiovascular: Normal rate, regular rhythm.  Pulmonary/chest: Effort normal and breath sounds normal. No wheezing, rales or rhonchi. Abdominal: Soft, nondistended, nontender. . There are no masses palpable. No hepatomegaly. Extremities: no edema Lymphadenopathy: No cervical adenopathy noted. Neurological: Alert and oriented to person place and time. Skin: Skin is warm and dry. No rashes noted. Psychiatric: Normal mood and affect. Behavior is normal.   ASSESSMENT AND PLAN: 69 year old female here for reassessment of the following issues:  Epigastric pain / GERD / weight loss / NSAID use - history as outlined above, high on differential is peptic ulcer disease in the setting of NSAIDs, while reflux esophagitis also possible.  Given the symptoms and her weight loss I am recommending an upper endoscopy to further evaluate confirm/clarify the diagnosis, ensure no mass lesions in the stomach etc. given her age.  I discussed risk and benefits of endoscopy and anesthesia with her and following this discussion she wanted to proceed.  In the interim I recommend that she hold off on the Mobic and her aspirin right now until this is further clarified, she takes aspirin for preventative purposes and denies any history of heart attack or stroke.  She can use Tylenol as needed for her joint pains in the interim.  Otherwise in the interim recommend she start omeprazole 40 mg once a day, and can continue Tums as needed if that helps.  EGD is scheduled within the next week, further recommendations pending  the results.  She agreed with the plan.   I spent 30 minutes of time, including in depth chart review, independent review of results as outlined above, communicating results with the patient directly, face-to-face time with the patient, coordinating care, and ordering studies and medications as appropriate, and documenting this encounter.   64, MD [facility]

## 2019-06-13 NOTE — Patient Instructions (Addendum)
If you are age 71 or older, your body mass index should be between 23-30. Your Body mass index is 22.87 kg/m. If this is out of the aforementioned range listed, please consider follow up with your Primary Care Provider.  If you are age 84 or younger, your body mass index should be between 19-25. Your Body mass index is 22.87 kg/m. If this is out of the aformentioned range listed, please consider follow up with your Primary Care Provider.   We have sent the following medications to your pharmacy for you to pick up at your convenience: Omeprazole 40mg : Take once daily  Stop taking mobic and aspirin.  Use Tylenol as needed.  Thank you for entrusting me with your care and for choosing [facility], Dr. [facility]

## 2019-06-14 ENCOUNTER — Other Ambulatory Visit: Payer: Self-pay

## 2019-06-14 ENCOUNTER — Ambulatory Visit (INDEPENDENT_AMBULATORY_CARE_PROVIDER_SITE_OTHER): Payer: Medicare Other

## 2019-06-14 DIAGNOSIS — Z1159 Encounter for screening for other viral diseases: Secondary | ICD-10-CM

## 2019-06-15 LAB — SARS CORONAVIRUS 2 (TAT 6-24 HRS): SARS Coronavirus 2: NEGATIVE

## 2019-06-18 ENCOUNTER — Encounter: Payer: Self-pay | Admitting: Gastroenterology

## 2019-06-18 ENCOUNTER — Ambulatory Visit (AMBULATORY_SURGERY_CENTER): Payer: Medicare Other | Admitting: Gastroenterology

## 2019-06-18 ENCOUNTER — Other Ambulatory Visit: Payer: Self-pay

## 2019-06-18 VITALS — BP 109/61 | HR 68 | Temp 97.7°F | Resp 11 | Ht 67.0 in | Wt 146.0 lb

## 2019-06-18 DIAGNOSIS — R1013 Epigastric pain: Secondary | ICD-10-CM | POA: Diagnosis present

## 2019-06-18 DIAGNOSIS — K219 Gastro-esophageal reflux disease without esophagitis: Secondary | ICD-10-CM | POA: Diagnosis not present

## 2019-06-18 DIAGNOSIS — K259 Gastric ulcer, unspecified as acute or chronic, without hemorrhage or perforation: Secondary | ICD-10-CM

## 2019-06-18 DIAGNOSIS — K2900 Acute gastritis without bleeding: Secondary | ICD-10-CM | POA: Diagnosis not present

## 2019-06-18 DIAGNOSIS — K222 Esophageal obstruction: Secondary | ICD-10-CM

## 2019-06-18 DIAGNOSIS — K2950 Unspecified chronic gastritis without bleeding: Secondary | ICD-10-CM | POA: Diagnosis not present

## 2019-06-18 MED ORDER — SODIUM CHLORIDE 0.9 % IV SOLN: 500.0000 mL | Freq: Once | INTRAVENOUS | Status: DC

## 2019-06-18 MED ORDER — SUCRALFATE 1 G PO TABS: 1.0000 g | ORAL_TABLET | Freq: Four times a day (QID) | ORAL | 3 refills | Status: DC | PRN

## 2019-06-18 MED ORDER — OMEPRAZOLE 40 MG PO CPDR: 40.0000 mg | DELAYED_RELEASE_CAPSULE | Freq: Two times a day (BID) | ORAL | 0 refills | Status: DC

## 2019-06-18 NOTE — Progress Notes (Signed)
Pt's states no medical or surgical changes since previsit or office visit.  AG iV, DT vitals, LC temp.

## 2019-06-18 NOTE — Op Note (Signed)
[facility] Patient Name: Gloria Moore Procedure Date: 06/18/2019 1:56 PM MRN: [id number] Endoscopist: Remo Lipps P. Havery Moros , MD Age: 69 Referring MD:  Date of Birth: 02/02/1951 Gender: Female Account #: [id number] Procedure:                Upper GI endoscopy Indications:              Epigastric abdominal pain, Follow-up of                            gastro-esophageal reflux disease, recent NSAID use                            + aspirin, now on omeprazole 40mg  once daily with                            some improvement, patient denies dysphagia Medicines:                Monitored Anesthesia Care Procedure:                Pre-Anesthesia Assessment:                           - Prior to the procedure, a History and Physical                            was performed, and patient medications and                            allergies were reviewed. The patient's tolerance of                            previous anesthesia was also reviewed. The risks                            and benefits of the procedure and the sedation                            options and risks were discussed with the patient.                            All questions were answered, and informed consent                            was obtained. Prior Anticoagulants: The patient has                            taken no previous anticoagulant or antiplatelet                            agents. ASA Grade Assessment: II - A patient with                            mild systemic disease. After reviewing the risks  and benefits, the patient was deemed in                            satisfactory condition to undergo the procedure.                           After obtaining informed consent, the endoscope was                            passed under direct vision. Throughout the                            procedure, the patient's blood pressure, pulse, and                            oxygen saturations  were monitored continuously. The                            Endoscope was introduced through the mouth, and                            advanced to the second part of duodenum. The upper                            GI endoscopy was accomplished without difficulty.                            The patient tolerated the procedure well. Scope In: Scope Out: Findings:                 Esophagogastric landmarks were identified: the                            Z-line was found at 33 cm, the gastroesophageal                            junction was found at 33 cm and the upper extent of                            the gastric folds was found at 35 cm from the                            incisors.                           A 2 cm hiatal hernia was present.                           One benign-appearing, intrinsic mild stenosis was                            found 33 cm from the incisors. This stenosis  measured less than one cm (in length). Dilation not                            performed today as patient is asymptomatic.                           The exam of the esophagus was otherwise normal.                           Patchy erythematous mucosa was found in the gastric                            body.                           The exam of the stomach was otherwise normal.                           Biopsies were taken with a cold forceps in the                            gastric body, at the incisura and in the gastric                            antrum for Helicobacter pylori testing.                           One cratered duodenal ulcer with pigmented material                            was found in the duodenal bulb. The lesion was 8-10                            mm in largest dimension. There was significant                            edema in the distal bulb with narrowed lumen                            entering the sweep.                           The exam of the  duodenum was otherwise normal. Complications:            No immediate complications. Estimated blood loss:                            Minimal. Estimated Blood Loss:     Estimated blood loss was minimal. Impression:               - Esophagogastric landmarks identified.                           - 2 cm hiatal hernia.                           -  Benign-appearing mild esophageal stenosis - no                            dilation performed as patient denies dysphagia.                           - Normal esophagus otherwise                           - Erythematous mucosa in the gastric body.                           - Normal stomach otherwise - biopsies taken to rule                            out H pylori                           - Cratered duodenal ulcer with with edematous /                            narrowed lumen at the distal bulb which is the                            likely cause of the patient's symptoms. Recommendation:           - Patient has a contact number available for                            emergencies. The signs and symptoms of potential                            delayed complications were discussed with the                            patient. Return to normal activities tomorrow.                            Written discharge instructions were provided to the                            patient.                           - Resume previous diet.                           - Continue present medications.                           - Increase omeprazole to 40mg  twice daily for 1                            month then back down to 40mg  once daily thereafter  for another month                           - Avoid all NSAIDs for now, use tylenol PRN for pain                           - Add carafate 1gm every 6 hours as needed for                            breakthrough pain (this can cause stools to be dark)                           - Await pathology  results with further                            recommendations. Viviann Spare P. Adela Lank, MD 06/18/2019 2:22:18 PM This report has been signed electronically.

## 2019-06-18 NOTE — Patient Instructions (Signed)
NO NSAIDS FOR NOW, USE TYLENOL AS NEEDED FOR PAIN INCREASE OMEPRAZOLE TO 40MG  TWICE DAILY FOR 1 MONTH THEN BACK DOWN TO 40MG  ONCE DAILY THEREAFTER FOR ANOTHER MONTH ADD CARAFATE 1 GM EVERY 6 HOURS AS NEEDED FOR BREAKTHROUGH PAIN (THIS CAN CAUSE STOOLS TO BE DARK)     YOU HAD AN ENDOSCOPIC PROCEDURE TODAY AT THE [facility]:   Refer to the procedure report that was given to you for any specific questions about what was found during the examination.  If the procedure report does not answer your questions, please call your gastroenterologist to clarify.  If you requested that your care partner not be given the details of your procedure findings, then the procedure report has been included in a sealed envelope for you to review at your convenience later.  YOU SHOULD EXPECT: Some feelings of bloating in the abdomen. Passage of more gas than usual.  Walking can help get rid of the air that was put into your GI tract during the procedure and reduce the bloating. If you had a lower endoscopy (such as a colonoscopy or flexible sigmoidoscopy) you may notice spotting of blood in your stool or on the toilet paper. If you underwent a bowel prep for your procedure, you may not have a normal bowel movement for a few days.  Please Note:  You might notice some irritation and congestion in your nose or some drainage.  This is from the oxygen used during your procedure.  There is no need for concern and it should clear up in a day or so.  SYMPTOMS TO REPORT IMMEDIATELY:   Following upper endoscopy (EGD)  Vomiting of blood or coffee ground material  New chest pain or pain under the shoulder blades  Painful or persistently difficult swallowing  New shortness of breath  Fever of 100F or higher  Black, tarry-looking stools  For urgent or emergent issues, a gastroenterologist can be reached at any hour by calling [phone]. Do not use MyChart messaging for urgent concerns.    DIET:  We do  recommend a small meal at first, but then you may proceed to your regular diet.  Drink plenty of fluids but you should avoid alcoholic beverages for 24 hours.  ACTIVITY:  You should plan to take it easy for the rest of today and you should NOT DRIVE or use heavy machinery until tomorrow (because of the sedation medicines used during the test).    FOLLOW UP: Our staff will call the number listed on your records 48-72 hours following your procedure to check on you and address any questions or concerns that you may have regarding the information given to you following your procedure. If we do not reach you, we will leave a message.  We will attempt to reach you two times.  During this call, we will ask if you have developed any symptoms of COVID 19. If you develop any symptoms (ie: fever, flu-like symptoms, shortness of breath, cough etc.) before then, please call [phone].  If you test positive for Covid 19 in the 2 weeks post procedure, please call and report this information to .    If any biopsies were taken you will be contacted by phone or by letter within the next 1-3 weeks.  Please call [phone] at [phone] if you have not heard about the biopsies in 3 weeks.    SIGNATURES/CONFIDENTIALITY: You and/or your care partner have signed paperwork which will be entered into your electronic medical record.  These signatures attest to the fact that that the information above on your After Visit Summary has been reviewed and is understood.  Full responsibility of the confidentiality of this discharge information lies with you and/or your care-partner.

## 2019-06-18 NOTE — Progress Notes (Signed)
Called to room to assist during endoscopic procedure.  Patient ID and intended procedure confirmed with present staff. Received instructions for my participation in the procedure from the performing physician.  

## 2019-06-18 NOTE — Progress Notes (Signed)
PT taken to PACU. Monitors in place. VSS. Report given to RN. 

## 2019-06-20 ENCOUNTER — Telehealth: Payer: Self-pay

## 2019-06-20 NOTE — Telephone Encounter (Signed)
  Follow up Call-  Call back number 06/18/2019 12/21/2016  Post procedure Call Back phone  # [phone]  Permission to leave phone message Yes Yes  Some recent data might be hidden     Patient questions:  Do you have a fever, pain , or abdominal swelling? No. Pain Score  0 *  Have you tolerated food without any problems? Yes.    Have you been able to return to your normal activities? No.  Do you have any questions about your discharge instructions: Diet   No. Medications  No. Follow up visit  No.  Do you have questions or concerns about your Care? No.  Actions: * If pain score is 4 or above: No action needed, pain <4.  1. Have you developed a fever since your procedure? no  2.   Have you had an respiratory symptoms (SOB or cough) since your procedure? no  3.   Have you tested positive for COVID 19 since your procedure no  4.   Have you had any family members/close contacts diagnosed with the COVID 19 since your procedure?  no   If yes to any of these questions please route to Laverna Peace, RN and Jennye Boroughs, Charity fundraiser.

## 2019-07-22 ENCOUNTER — Telehealth: Payer: Self-pay | Admitting: Gastroenterology

## 2019-07-22 NOTE — Telephone Encounter (Signed)
Called and spoke to pt.  Dr. Adela Lank indicated on EGD report "Increase omeprazole to 40mg  twice daily for 1 month then back down to 40mg  once daily thereafter for another month".  She reports it is helping her and wanted to know if she should continue medication until she sees him next month for her appt.  I confirmed she can continue 40mg  once daily until her OV and can discuss continuing.

## 2019-07-22 NOTE — Telephone Encounter (Signed)
Patient called said she is taking Omeprazole 40mg  and is wanting to know if she is to stop after 30 days?

## 2019-07-27 ENCOUNTER — Other Ambulatory Visit: Payer: Self-pay | Admitting: Gastroenterology

## 2019-07-27 DIAGNOSIS — R1013 Epigastric pain: Secondary | ICD-10-CM

## 2019-07-28 ENCOUNTER — Other Ambulatory Visit: Payer: Self-pay

## 2019-08-26 ENCOUNTER — Other Ambulatory Visit: Payer: Self-pay | Admitting: Obstetrics and Gynecology

## 2019-08-26 DIAGNOSIS — Z1231 Encounter for screening mammogram for malignant neoplasm of breast: Secondary | ICD-10-CM

## 2019-08-29 ENCOUNTER — Encounter: Payer: Self-pay | Admitting: Gastroenterology

## 2019-08-29 ENCOUNTER — Ambulatory Visit (INDEPENDENT_AMBULATORY_CARE_PROVIDER_SITE_OTHER): Payer: Medicare Other | Admitting: Gastroenterology

## 2019-08-29 VITALS — BP 166/78 | HR 74 | Temp 98.0°F | Ht 64.5 in | Wt 144.0 lb

## 2019-08-29 DIAGNOSIS — K269 Duodenal ulcer, unspecified as acute or chronic, without hemorrhage or perforation: Secondary | ICD-10-CM | POA: Diagnosis not present

## 2019-08-29 NOTE — Patient Instructions (Signed)
If you are age 69 or older, your body mass index should be between 23-30. Your Body mass index is 24.34 kg/m. If this is out of the aforementioned range listed, please consider follow up with your Primary Care Provider.  If you are age 17 or younger, your body mass index should be between 19-25. Your Body mass index is 24.34 kg/m. If this is out of the aformentioned range listed, please consider follow up with your Primary Care Provider.   Continue omeprazole 20 mg: Take daily as needed  Thank you for entrusting me with your care and for choosing [facility], Dr. Ileene Patrick

## 2019-08-29 NOTE — Progress Notes (Signed)
HPI :  69 year old female here for follow-up visit for history of duodenal ulcer.  She saw me a few months ago for epigastric discomfort that has been ongoing for a few months.  This was in the setting of taking meloxicam 15 mg a day as well as aspirin daily.  We gave her omeprazole empirically and scheduled for EGD which was completed on March 2.  She had a 1 cm cratered duodenal ulcer that was causing her symptoms.  Biopsies of her stomach were negative for H. pylori, also presumed due to NSAIDs.  I had recommended omeprazole 40 mg twice a day for 4 weeks and then reduce to 1 tab daily.  She has been compliant doing that.  She states the omeprazole definitely worked in her upper tract index symptoms have completely resolved purely she has stopped all of her NSAIDs, using Tylenol only as needed for aches and pains.  She has been using Carafate as needed for occasional cramps.  She states she has occasional lower abdominal tenderness when she sleeps on her abdomen, otherwise this does not really bother her much.  Her bowels are normal.  No blood in her stools at all.  She is otherwise feeling quite well and has no complaints today.  Eating well, no weight loss.  No cardiopulmonary symptoms  Colonoscopy 12/21/2016 -  - A few medium-mouthed diverticula were found in the sigmoid colon. - Internal hemorrhoids were found during retroflexion. - The hepatic flexure was quite angulated with difficult visualization, although multiple passes made with the colonoscope in this area and no abnormalities noted. The exam was otherwise without abnormality.  EGD 06/18/19 -  - A 2 cm hiatal hernia was present. - One benign-appearing, intrinsic mild stenosis was found 33 cm from the incisors. This stenosis measured less than one cm (in length). Dilation not performed today as patient is asymptomatic. - The exam of the esophagus was otherwise normal. - Patchy erythematous mucosa was found in the gastric body. - The  exam of the stomach was otherwise normal. - Biopsies were taken with a cold forceps in the gastric body, at the incisura and in the gastric antrum for Helicobacter pylori testing. - One cratered duodenal ulcer with pigmented material was found in the duodenal bulb. The lesion was 8-10 mm in largest dimension. There was significant edema in the distal bulb with narrowed lumen entering the sweep. - The exam of the duodenum was otherwise normal.   Past Medical History:  Diagnosis Date  . Allergy   . Arthritis   . Diverticula of colon   . Duodenal ulcer   . GERD (gastroesophageal reflux disease)   . Hemorrhoids    internal and external  . Hyperlipidemia   . Hypertension      Past Surgical History:  Procedure Laterality Date  . Bladder Stretched    . TUBAL LIGATION Bilateral   . WISDOM TOOTH EXTRACTION     Family History  Problem Relation Age of Onset  . Dementia Mother   . Heart disease Father   . Other Sister        gallbladder cancer  . Colon polyps Neg Hx   . Esophageal cancer Neg Hx   . Pancreatic cancer Neg Hx   . Rectal cancer Neg Hx   . Stomach cancer Neg Hx    Social History   Tobacco Use  . Smoking status: Never Smoker  . Smokeless tobacco: Never Used  Substance Use Topics  . Alcohol use: No  .  Drug use: No   Current Outpatient Medications  Medication Sig Dispense Refill  . atorvastatin (LIPITOR) 10 MG tablet Take 5 mg by mouth daily. One ten mg tablet cut in half taken daily.     . cetirizine (ZYRTEC) 10 MG tablet Take 10 mg by mouth daily.    . Coenzyme Q10 (COQ-10) 100 MG CAPS Take 300 mg by mouth daily.    Marland Kitchen EPINEPHrine 0.3 mg/0.3 mL IJ SOAJ injection Inject 1 mg into the muscle once.    . hydrochlorothiazide (HYDRODIURIL) 25 MG tablet Take 12.5 mg by mouth daily.     . metoprolol succinate (TOPROL-XL) 25 MG 24 hr tablet Take 25 mg by mouth daily.    . montelukast (SINGULAIR) 10 MG tablet Take 10 mg by mouth at bedtime.    Marland Kitchen omeprazole (PRILOSEC) 40  MG capsule Take 1 capsule (40 mg total) by mouth daily. Please keep your appt with Dr. Adela Lank in May 30 capsule 0  . OVER THE COUNTER MEDICATION Calcium and Vitamin D 600 mg two times daily.    . Probiotic Product (PROBIOTIC-10 ULTIMATE PO) Take by mouth.    . sucralfate (CARAFATE) 1 g tablet Take 1 tablet (1 g total) by mouth 4 (four) times daily as needed. 120 tablet 3  . triamcinolone (NASACORT ALLERGY 24HR CHILDREN) 55 MCG/ACT AERO nasal inhaler Place 2 sprays into the nose daily.     Current Facility-Administered Medications  Medication Dose Route Frequency Provider Last Rate Last Admin  . 0.9 %  sodium chloride infusion  500 mL Intravenous Once Jocelyne Reinertsen, Willaim Rayas, MD       Allergies  Allergen Reactions  . Bee Venom Hives  . Penicillins Rash  . Sulfa Antibiotics Rash     Review of Systems: All systems reviewed and negative except where noted in HPI.    No results found.  Physical Exam: BP (!) 166/78   Pulse 74   Temp 98 F (36.7 C)   Ht 5' 4.5" (1.638 m) Comment: measured  Wt 144 lb (65.3 kg)   BMI 24.34 kg/m  Constitutional: Pleasant,well-developed, female in no acute distress. Abdominal: Soft, nondistended, nontender. There are no masses palpable. No hepatomegaly. Extremities: no edema Lymphadenopathy: No cervical adenopathy noted. Neurological: Alert and oriented to person place and time. Skin: Skin is warm and dry. No rashes noted. Psychiatric: Normal mood and affect. Behavior is normal.   ASSESSMENT AND PLAN: 69 y/o female here for reassessment of the following:  Duodenal ulcer - occurred in the setting of NSAID and aspirin use. Biopsies negative for H pylori. Ulcer was benign appearing. She has had resolution of her index symptoms with omeprazole 40mg  BID for 4 weeks and has been on 40mg  / day since then and carafate PRN. She has some occasional fleeting lower abdominal cramps, she thinks positional and related to sleeping, but no index upper / epigastric  pain any longer. I think okay to stop carafate and to wean off omeprazole at this point. She will take 40mg  every other day for 2 weeks then stop. I will give her omeprazole 20mg  po q day to use PRN in the future for periodic reflux symptoms. If she notes recurrence of index ulcer symptoms as she comes of the PPI / carafate I asked her to contact me and would do a relook endoscopy in that setting. However, given she had a duodenal ulcer and responded appropriately to therapy without any high risk lesions noted, I don't think we need to pursue a surveillance  endoscopy at this point. She agreed with the plan, I asked her to touch base with me in the future if any symptoms recur and asked her to avoid NSAIDs.  Ileene Patrick, MD [facility]

## 2019-10-07 ENCOUNTER — Ambulatory Visit
Admission: RE | Admit: 2019-10-07 | Discharge: 2019-10-07 | Disposition: A | Discharge status: Home / Self Care | Payer: Medicare Other | Source: Ambulatory Visit | Attending: Obstetrics and Gynecology | Admitting: Obstetrics and Gynecology

## 2019-10-07 ENCOUNTER — Ambulatory Visit: Payer: Medicare Other

## 2019-10-07 ENCOUNTER — Other Ambulatory Visit: Payer: Self-pay

## 2019-10-07 DIAGNOSIS — Z1231 Encounter for screening mammogram for malignant neoplasm of breast: Secondary | ICD-10-CM

## 2020-07-30 ENCOUNTER — Other Ambulatory Visit: Payer: Self-pay | Admitting: Family Medicine

## 2020-07-30 DIAGNOSIS — Z13820 Encounter for screening for osteoporosis: Secondary | ICD-10-CM

## 2020-08-18 ENCOUNTER — Other Ambulatory Visit: Payer: Self-pay | Admitting: Obstetrics and Gynecology

## 2020-08-18 DIAGNOSIS — Z1231 Encounter for screening mammogram for malignant neoplasm of breast: Secondary | ICD-10-CM

## 2020-09-17 ENCOUNTER — Ambulatory Visit (INDEPENDENT_AMBULATORY_CARE_PROVIDER_SITE_OTHER): Payer: Medicare Other | Admitting: Gastroenterology

## 2020-09-17 ENCOUNTER — Encounter: Payer: Self-pay | Admitting: Gastroenterology

## 2020-09-17 VITALS — BP 136/80 | HR 75 | Ht 65.0 in | Wt 145.0 lb

## 2020-09-17 DIAGNOSIS — R1030 Lower abdominal pain, unspecified: Secondary | ICD-10-CM | POA: Diagnosis not present

## 2020-09-17 DIAGNOSIS — K641 Second degree hemorrhoids: Secondary | ICD-10-CM | POA: Diagnosis not present

## 2020-09-17 MED ORDER — DICYCLOMINE HCL 10 MG PO CAPS
10.0000 mg | ORAL_CAPSULE | Freq: Three times a day (TID) | ORAL | 1 refills | Status: DC | PRN
Start: 2020-09-17 — End: 2020-12-19

## 2020-09-17 NOTE — Patient Instructions (Addendum)
If you are age 72 or older, your body mass index should be between 23-30. Your Body mass index is 24.13 kg/m. If this is out of the aforementioned range listed, please consider follow up with your Primary Care Provider.  If you are age 72 or younger, your body mass index should be between 19-25. Your Body mass index is 24.13 kg/m. If this is out of the aformentioned range listed, please consider follow up with your Primary Care Provider.   __________________________________________________________  The [place] [facility] providers would like to encourage you to use [facility] to communicate with providers for non-urgent requests or questions.  Due to long hold times on the telephone, sending your provider a message by [facility] may be a faster and more efficient way to get a response.  Please allow 48 business hours for a response.  Please remember that this is for non-urgent requests.  __________________________________________________________   [place] PROCEDURE    FOLLOW-UP CARE   1. The procedure you have had should have been relatively painless since the banding of the area involved does not have nerve endings and there is no pain sensation.  The rubber band cuts off the blood supply to the hemorrhoid and the band may fall off as soon as 48 hours after the banding (the band may occasionally be seen in the toilet bowl following a bowel movement). You may notice a temporary feeling of fullness in the rectum which should respond adequately to plain Tylenol or Motrin.  2. Following the banding, avoid strenuous exercise that evening and resume full activity the next day.  A sitz bath (soaking in a warm tub) or bidet is soothing, and can be useful for cleansing the area after bowel movements.     3. To avoid constipation, take two tablespoons of natural wheat bran, natural oat bran, flax, Benefiber or any over the counter fiber supplement and increase your water intake to 7-8 glasses daily.     4. Unless you have been prescribed anorectal medication, do not put anything inside your rectum for two weeks: No suppositories, enemas, fingers, etc.  5. Occasionally, you may have more bleeding than usual after the banding procedure.  This is often from the untreated hemorrhoids rather than the treated one.  Don't be concerned if there is a tablespoon or so of blood.  If there is more blood than this, lie flat with your bottom higher than your head and apply an ice pack to the area. If the bleeding does not stop within a half an hour or if you feel faint, call our office at [phone] or go to the emergency room.  6. Problems are not common; however, if there is a substantial amount of bleeding, severe pain, chills, fever or difficulty passing urine (very rare) or other problems, you should call us at [phone] or report to the nearest emergency room.  7. Do not stay seated continuously for more than 2-3 hours for a day or two after the procedure.  Tighten your buttock muscles 10-15 times every two hours and take 10-15 deep breaths every 1-2 hours.  Do not spend more than a few minutes on the toilet if you cannot empty your bowel; instead re-visit the toilet at a later time.    We have sent the following medications to your pharmacy for you to pick up at your convenience: Bentyl 10 mg: Take every 8 hours as needed   Thank you for entrusting me with your care and for  choosing [facility], Dr. Ileene Patrick

## 2020-09-17 NOTE — Progress Notes (Signed)
HPI :  70 year old female here for follow-up visit for hemorrhoids and abdominal pain.  I last saw her in March 2021.  She has a history of a duodenal ulcer in the setting of NSAIDs.  She has stopped using NSAIDs and completed course of PPI. She states she had a hemorrhoid prolapse about 2 weeks ago, was causing irritation and aching over the past 2 weeks.  She says it feels like there is a sense of fullness there.  No blood in her stool.  No rectal pain.  She denies any recent constipation or straining.  Generally feels irritated and that area for the past 2 weeks.  She underwent treatment for hemorrhoids in 2018 and completed hemorrhoid banding series.  She states this resolved her symptoms and she was doing quite well until this recent recurrence.  She states she would like another hemorrhoid banding if that is appropriate for her symptoms.  Otherwise the patient states she has been having some lower abdominal discomfort, ongoing for the past year or so.  It is suprapubic in nature and clearly below her umbilicus.  States it is mild, tends to come and go.  Can feel it in the morning when she wakes up.  There is no prandial relationship at all.  Denies any NSAID use since her ulcer.  Denies any reflux symptoms.  Uses Tylenol as needed which does not really help.  Her bowel habits are fairly regular, no diarrhea or constipation.  Having a bowel movement does not really take away the pain.  She denies any upper abdominal pain.  No weight loss.  No blood in her stool.   Colonoscopy 12/21/2016 - - A few medium-mouthed diverticula were found in the sigmoid colon. - Internal hemorrhoids were found during retroflexion. - The hepatic flexure was quite angulated with difficult visualization, although multiple passes made with the colonoscope in this area and no abnormalities noted. The exam was otherwise without abnormality.  EGD 06/18/19 -  - A 2 cm hiatal hernia was present. - One benign-appearing,  intrinsic mild stenosis was found 33 cm from the incisors. This stenosis measured less than one cm (in length). Dilation not performed today as patient is asymptomatic. - The exam of the esophagus was otherwise normal. - Patchy erythematous mucosa was found in the gastric body. - The exam of the stomach was otherwise normal. - Biopsies were taken with a cold forceps in the gastric body, at the incisura and in the gastric antrum for Helicobacter pylori testing. - One cratered duodenal ulcer with pigmented material was found in the duodenal bulb. The lesion was 8-10 mm in largest dimension. There was significant edema in the distal bulb with narrowed lumen entering the sweep. - The exam of the duodenum was otherwise normal.     Past Medical History:  Diagnosis Date  . Allergy   . Arthritis   . Diverticula of colon   . Duodenal ulcer   . GERD (gastroesophageal reflux disease)   . Hemorrhoids    internal and external  . Hyperlipidemia   . Hypertension      Past Surgical History:  Procedure Laterality Date  . Bladder Stretched    . TUBAL LIGATION Bilateral   . WISDOM TOOTH EXTRACTION     Family History  Problem Relation Age of Onset  . Dementia Mother   . Heart disease Father   . Other Sister        gallbladder cancer  . Colon polyps Neg Hx   .  Esophageal cancer Neg Hx   . Pancreatic cancer Neg Hx   . Rectal cancer Neg Hx   . Stomach cancer Neg Hx    Social History   Tobacco Use  . Smoking status: Never Smoker  . Smokeless tobacco: Never Used  Vaping Use  . Vaping Use: Never used  Substance Use Topics  . Alcohol use: No  . Drug use: No   Current Outpatient Medications  Medication Sig Dispense Refill  . atorvastatin (LIPITOR) 10 MG tablet Take 5 mg by mouth daily. One ten mg tablet cut in half taken daily.    . AZO-CRANBERRY PO Take 2 capsules by mouth daily.    . cetirizine (ZYRTEC) 10 MG tablet Take 10 mg by mouth daily.    . Coenzyme Q10 (COQ-10) 100 MG  CAPS Take 300 mg by mouth daily.    Marland Kitchen EPINEPHrine 0.3 mg/0.3 mL IJ SOAJ injection Inject 1 mg into the muscle once.    . hydrochlorothiazide (HYDRODIURIL) 25 MG tablet Take 25 mg by mouth daily.    . metoprolol succinate (TOPROL-XL) 25 MG 24 hr tablet Take 25 mg by mouth daily.    . montelukast (SINGULAIR) 10 MG tablet Take 10 mg by mouth at bedtime.    Marland Kitchen omeprazole (PRILOSEC) 40 MG capsule Take 40 mg by mouth daily as needed.    Marland Kitchen OVER THE COUNTER MEDICATION Calcium and Vitamin D 600 mg two times daily.    . Probiotic Product (PROBIOTIC-10 ULTIMATE PO) Take by mouth.    . triamcinolone (NASACORT) 55 MCG/ACT AERO nasal inhaler Place 2 sprays into the nose daily.     No current facility-administered medications for this visit.   Allergies  Allergen Reactions  . Nsaids     Other reaction(s): Bleeding  . Bee Venom Hives  . Penicillins Rash  . Sulfa Antibiotics Rash     Review of Systems: All systems reviewed and negative except where noted in HPI.   Labs reviewed in care everywhere from April 2022. Normal CBC and CMET  Physical Exam: BP 136/80   Pulse 75   Ht 5\' 5"  (1.651 m)   Wt 145 lb (65.8 kg)   BMI 24.13 kg/m  Constitutional: Pleasant,well-developed, female in no acute distress. Abdominal: Soft, nondistended, nontender. . There are no masses palpable. No hepatomegaly. DRE / Anoscopy - no mass lesions or external hemorrhoids, internal hemorrhoids noted inflamed in RP area and mildly in LL area Extremities: no edema Lymphadenopathy: No cervical adenopathy noted. Neurological: Alert and oriented to person place and time. Skin: Skin is warm and dry. No rashes noted. Psychiatric: Normal mood and affect. Behavior is normal.   ASSESSMENT AND PLAN: 70 year old female here for reassessment of the following issues:  Grade 2 internal hemorrhoid Lower abdominal pain  DRE/anoscopy confirms inflamed hemorrhoid in the RP area which I think is correlating to her symptoms.  We  discussed options.  She has only had symptoms for a few weeks, could treat this conservatively with suppositories as needed and monitoring versus repeating banding in that area.  Discussed both of these options with her, she strongly wishes to have banding performed given it works well for her previously.  Discussed risk benefits of hemorrhoid banding with her, she wanted to proceed, RP hemorrhoid banded.  Otherwise we discussed her abdominal pain, rather nonspecific, mild and intermittent, discussed differential diagnosis.  Labs stable and reassuring.  I think CT scan abdomen pelvis is reasonable, she has never had prior imaging and has had pain for  the past year.  We discussed that and she did not want to proceed with that and wanted to think more about it.  In the interim we will give her an empiric trial of Bentyl 10 mg every 8 hours as needed to see if that will make any changes, although counseled her if her symptoms persist I really do think a CT scan is the next step to make sure things okay.  Again she wishes to think about this before committing to any imaging.  She has no imaging on file historically for abdomen pelvis.  All questions answered, she agreed with the plan.  Plan: - RP hemorrhoid banded today - recommended CT scan abdomen pelvis to evaluate abdominal pain, she declines - will give empiric trial of bentyl PRN for abdominal pain in the interim.  If symptoms persist or progress despite this she will contact me and consider CT in the setting.  Otherwise follow up as needed, await course post banding.   Gloria Patrick, MD [place] [facility]    PROCEDURE NOTE: The patient presents with symptomatic grade II  hemorrhoids, requesting rubber band ligation of his/her hemorrhoidal disease.  All risks, benefits and alternative forms of therapy were described and informed consent was obtained.  In the Left Lateral Decubitus position anoscopic examination revealed grade II  hemorrhoids in the RP and LL position(s).  The anorectum was pre-medicated with 0.125% nitroglycerin The decision was made to band the RP internal hemorrhoid, and the [facility] O'Regan System was used to perform band ligation without complication.  Digital anorectal examination was then performed to assure proper positioning of the band, and to adjust the banded tissue as required.  The patient was discharged home without pain or other issues.  Dietary and behavioral recommendations were given and along with follow-up instructions.     The patient will return as needed for  follow-up. No complications were encountered and the patient tolerated the procedure well.

## 2020-09-24 ENCOUNTER — Ambulatory Visit
Admission: RE | Admit: 2020-09-24 | Discharge: 2020-09-24 | Disposition: A | Discharge status: Home / Self Care | Payer: Medicare Other | Source: Ambulatory Visit | Attending: Family Medicine | Admitting: Family Medicine

## 2020-09-24 ENCOUNTER — Other Ambulatory Visit: Payer: Self-pay

## 2020-09-24 DIAGNOSIS — Z13820 Encounter for screening for osteoporosis: Secondary | ICD-10-CM

## 2020-09-28 ENCOUNTER — Other Ambulatory Visit: Payer: Medicare Other

## 2020-10-08 ENCOUNTER — Ambulatory Visit
Admission: RE | Admit: 2020-10-08 | Discharge: 2020-10-08 | Disposition: A | Discharge status: Home / Self Care | Payer: Medicare Other | Source: Ambulatory Visit | Attending: Obstetrics and Gynecology | Admitting: Obstetrics and Gynecology

## 2020-10-08 ENCOUNTER — Other Ambulatory Visit: Payer: Self-pay

## 2020-10-08 DIAGNOSIS — Z1231 Encounter for screening mammogram for malignant neoplasm of breast: Secondary | ICD-10-CM

## 2020-12-16 ENCOUNTER — Ambulatory Visit (INDEPENDENT_AMBULATORY_CARE_PROVIDER_SITE_OTHER): Payer: Medicare Other | Admitting: Gastroenterology

## 2020-12-16 ENCOUNTER — Encounter: Payer: Self-pay | Admitting: Gastroenterology

## 2020-12-16 VITALS — BP 132/72 | HR 76 | Ht 64.5 in | Wt 145.6 lb

## 2020-12-16 DIAGNOSIS — K641 Second degree hemorrhoids: Secondary | ICD-10-CM | POA: Diagnosis not present

## 2020-12-16 MED ORDER — CALMOL-4 76-10 % RE SUPP
RECTAL | 0 refills | Status: DC
Start: 2020-12-16 — End: 2023-08-28

## 2020-12-16 NOTE — Patient Instructions (Addendum)
If you are age 72 or older, your body mass index should be between 23-30. Your Body mass index is 24.61 kg/m. If this is out of the aforementioned range listed, please consider follow up with your Primary Care Provider.  If you are age 35 or younger, your body mass index should be between 19-25. Your Body mass index is 24.61 kg/m. If this is out of the aformentioned range listed, please consider follow up with your Primary Care Provider.   __________________________________________________________  The [place] [facility] providers would like to encourage you to use [facility] to communicate with providers for non-urgent requests or questions.  Due to long hold times on the telephone, sending your provider a message by [facility] may be a faster and more efficient way to get a response.  Please allow 48 business hours for a response.  Please remember that this is for non-urgent requests.   HEMORRHOID BANDING PROCEDURE    FOLLOW-UP CARE   The procedure you have had should have been relatively painless since the banding of the area involved does not have nerve endings and there is no pain sensation.  The rubber band cuts off the blood supply to the hemorrhoid and the band may fall off as soon as 48 hours after the banding (the band may occasionally be seen in the toilet bowl following a bowel movement). You may notice a temporary feeling of fullness in the rectum which should respond adequately to plain Tylenol or Motrin.  Following the banding, avoid strenuous exercise that evening and resume full activity the next day.  A sitz bath (soaking in a warm tub) or bidet is soothing, and can be useful for cleansing the area after bowel movements.     To avoid constipation, take two tablespoons of natural wheat bran, natural oat bran, flax, Benefiber or any over the counter fiber supplement and increase your water intake to 7-8 glasses daily.    Unless you have been prescribed anorectal medication, do not put  anything inside your rectum for two weeks: No suppositories, enemas, fingers, etc.  Occasionally, you may have more bleeding than usual after the banding procedure.  This is often from the untreated hemorrhoids rather than the treated one.  Don't be concerned if there is a tablespoon or so of blood.  If there is more blood than this, lie flat with your bottom higher than your head and apply an ice pack to the area. If the bleeding does not stop within a half an hour or if you feel faint, call our office at [phone] or go to the emergency room.  Problems are not common; however, if there is a substantial amount of bleeding, severe pain, chills, fever or difficulty passing urine (very rare) or other problems, you should call us at [phone] or report to the nearest emergency room.  Do not stay seated continuously for more than 2-3 hours for a day or two after the procedure.  Tighten your buttock muscles 10-15 times every two hours and take 10-15 deep breaths every 1-2 hours.  Do not spend more than a few minutes on the toilet if you cannot empty your bowel; instead re-visit the toilet at a later time.   We have given you samples of the following medication to take: Calmol 4 suppository: Take as directed as needed   Thank you for entrusting me with your care and for choosing [facility], Dr. Ileene Patrick

## 2020-12-16 NOTE — Progress Notes (Signed)
70 year old female with a history of grade 2 hemorrhoids here for follow-up visit for additional banding.  I saw her in June for flare of her hemorrhoids.  Recall she had a banding series about 4 years ago which worked really well for her symptoms until she had recurrence in recent months.  She states the band initially worked quite well and resolved her symptoms, however she thinks she now has another hemorrhoid that has been bothering her lately with irritation and a fullness feeling.  She is not had any more bleeding.  She denies constipation.  She strongly wishes to proceed with another banding rather than seek a surgical evaluation or try other topical therapies. Banded RP hemorrhoid on 09/17/20 and she tolerated it well.   PROCEDURE NOTE: The patient presents with symptomatic grade II  hemorrhoids, requesting rubber band ligation of his/her hemorrhoidal disease.  All risks, benefits and alternative forms of therapy were described and informed consent was obtained.   The anorectum was pre-medicated with 0.125% nitroglycerin ointment. Anoscopy showed hemorrhoids in the left lateral and RP area. The decision was made to band the LL internal hemorrhoid, and the [facility] O'Regan System was used to perform band ligation without complication.  Digital anorectal examination was then performed to assure proper positioning of the band, and to adjust the banded tissue as required.  The patient was discharged home without pain or other issues.  Dietary and behavioral recommendations were given and along with follow-up instructions.     The following adjunctive treatments were recommended: Calmol4 suppositories PRN moving forward  The patient will return for follow-up and possible additional banding as required. No complications were encountered and the patient tolerated the procedure well.  Harlin Rain, MD [facility]

## 2020-12-19 ENCOUNTER — Other Ambulatory Visit: Payer: Self-pay | Admitting: Gastroenterology

## 2021-01-13 ENCOUNTER — Encounter: Payer: Medicare Other | Admitting: Gastroenterology

## 2021-09-09 ENCOUNTER — Other Ambulatory Visit: Payer: Self-pay | Admitting: Obstetrics and Gynecology

## 2021-09-09 DIAGNOSIS — Z1231 Encounter for screening mammogram for malignant neoplasm of breast: Secondary | ICD-10-CM

## 2021-10-13 ENCOUNTER — Ambulatory Visit
Admission: RE | Admit: 2021-10-13 | Discharge: 2021-10-13 | Disposition: A | Discharge status: Home / Self Care | Payer: Medicare Other | Source: Ambulatory Visit | Attending: Obstetrics and Gynecology | Admitting: Obstetrics and Gynecology

## 2021-10-13 DIAGNOSIS — Z1231 Encounter for screening mammogram for malignant neoplasm of breast: Secondary | ICD-10-CM

## 2022-08-11 ENCOUNTER — Other Ambulatory Visit: Payer: Self-pay | Admitting: Obstetrics and Gynecology

## 2022-08-11 DIAGNOSIS — Z1231 Encounter for screening mammogram for malignant neoplasm of breast: Secondary | ICD-10-CM

## 2022-10-17 ENCOUNTER — Ambulatory Visit
Admission: RE | Admit: 2022-10-17 | Discharge: 2022-10-17 | Disposition: A | Discharge status: Home / Self Care | Payer: Medicare Other | Source: Ambulatory Visit | Attending: Obstetrics and Gynecology | Admitting: Obstetrics and Gynecology

## 2022-10-17 DIAGNOSIS — Z1231 Encounter for screening mammogram for malignant neoplasm of breast: Secondary | ICD-10-CM

## 2023-08-24 ENCOUNTER — Other Ambulatory Visit: Payer: Self-pay | Admitting: Family Medicine

## 2023-08-24 DIAGNOSIS — Z1231 Encounter for screening mammogram for malignant neoplasm of breast: Secondary | ICD-10-CM

## 2023-08-27 NOTE — Progress Notes (Unsigned)
 Chief Complaint: Primary GI MD: Dr. General Kenner  HPI:  *** is a  ***  who was referred to me by Corrington, Kip A, MD for a complaint of *** .     Discussed the use of AI scribe software for clinical note transcription with the patient, who gave verbal consent to proceed.  History of Present Illness      PREVIOUS GI WORKUP   EGD 06/18/19 -  - A 2 cm hiatal hernia was present. - One benign-appearing, intrinsic mild stenosis was found 33 cm from the incisors. This stenosis measured less than one cm (in length). Dilation not performed today as patient is asymptomatic. - The exam of the esophagus was otherwise normal. - Patchy erythematous mucosa was found in the gastric body. - The exam of the stomach was otherwise normal. - Biopsies were taken with a cold forceps in the gastric body, at the incisura and in the gastric antrum for Helicobacter pylori testing. - One cratered duodenal ulcer with pigmented material was found in the duodenal bulb. The lesion was 8-10 mm in largest dimension. There was significant edema in the distal bulb with narrowed lumen entering the sweep. - The exam of the duodenum was otherwise normal.  Colonoscopy 12/2016 for screening - Small external hemorrhoids  - Diverticulosis in the sigmoid colon.  - Internal hemorrhoids.  - The examination was otherwise normal.  - No specimens collected.  Past Medical History:  Diagnosis Date   Allergy    Arthritis    Diverticula of colon    Duodenal ulcer    GERD (gastroesophageal reflux disease)    Hemorrhoids    internal and external   Hyperlipidemia    Hypertension     Past Surgical History:  Procedure Laterality Date   Bladder Stretched     TUBAL LIGATION Bilateral    WISDOM TOOTH EXTRACTION      Current Outpatient Medications  Medication Sig Dispense Refill   atorvastatin (LIPITOR) 10 MG tablet Take 5 mg by mouth daily. One ten mg tablet cut in half taken daily.     AZO-CRANBERRY PO Take 2  capsules by mouth daily.     cetirizine (ZYRTEC) 10 MG tablet Take 10 mg by mouth daily.     Coenzyme Q10 (COQ-10) 100 MG CAPS Take 300 mg by mouth daily.     dicyclomine  (BENTYL ) 10 MG capsule TAKE 1 CAPSULE BY MOUTH EVERY 8 HOURS AS NEEDED FOR  SPASMS 30 capsule 0   EPINEPHrine 0.3 mg/0.3 mL IJ SOAJ injection Inject 1 mg into the muscle once.     hydrochlorothiazide (HYDRODIURIL) 25 MG tablet Take 25 mg by mouth daily.     metoprolol succinate (TOPROL-XL) 25 MG 24 hr tablet Take 25 mg by mouth daily.     montelukast (SINGULAIR) 10 MG tablet Take 10 mg by mouth at bedtime.     omeprazole  (PRILOSEC) 40 MG capsule Take 40 mg by mouth daily as needed.     OVER THE COUNTER MEDICATION Calcium and Vitamin D 1200 mg two times daily.     Probiotic Product (PROBIOTIC-10 ULTIMATE PO) Take by mouth.     Rectal Protectant-Emollient (CALMOL-4) 76-10 % SUPP Use as directed once to twice daily 8 suppository 0   triamcinolone (NASACORT) 55 MCG/ACT AERO nasal inhaler Place 2 sprays into the nose daily.     No current facility-administered medications for this visit.    Allergies as of 08/28/2023 - Review Complete 12/16/2020  Allergen Reaction Noted   Nsaids  07/29/2020   Bee venom Hives 12/07/2016   Penicillins Rash 12/07/2016   Sulfa antibiotics Rash 12/07/2016    Family History  Problem Relation Age of Onset   Dementia Mother    Heart disease Father    Other Sister        gallbladder cancer   Colon polyps Neg Hx    Esophageal cancer Neg Hx    Pancreatic cancer Neg Hx    Rectal cancer Neg Hx    Stomach cancer Neg Hx     Social History   Socioeconomic History   Marital status: Married    Spouse name: Not on file   Number of children: 2   Years of education: Not on file   Highest education level: Not on file  Occupational History   Occupation: retired  Tobacco Use   Smoking status: Never   Smokeless tobacco: Never  Vaping Use   Vaping status: Never Used  Substance and Sexual  Activity   Alcohol use: No   Drug use: No   Sexual activity: Not on file  Other Topics Concern   Not on file  Social History Narrative   Not on file   Social Drivers of Health   Financial Resource Strain: Low Risk  (08/03/2023)   Received from [facility]   Overall Financial Resource Strain (CARDIA)    Difficulty of Paying Living Expenses: Not hard at all  Food Insecurity: No Food Insecurity (08/03/2023)   Received from [facility]   Hunger Vital Sign    Worried About Running Out of Food in the Last Year: Never true    Ran Out of Food in the Last Year: Never true  Transportation Needs: No Transportation Needs (08/03/2023)   Received from [facility] - Transportation    Lack of Transportation (Medical): No    Lack of Transportation (Non-Medical): No  Physical Activity: Unknown (08/03/2023)   Received from [facility]   Exercise Vital Sign    Days of Exercise per Week: 0 days    Minutes of Exercise per Session: Not on file  Stress: No Stress Concern Present (08/03/2023)   Received from [facility] - Occupational Stress Questionnaire    Feeling of Stress : Not at all  Social Connections: Socially Integrated (08/03/2023)   Received from [facility]   Social Network    How would you rate your social network (family, work, friends)?: Good participation with social networks  Intimate Partner Violence: Not At Risk (08/03/2023)   Received from [facility]   HITS    Over the last 12 months how often did your partner physically hurt you?: Never    Over the last 12 months how often did your partner insult you or talk down to you?: Never    Over the last 12 months how often did your partner threaten you with physical harm?: Never    Over the last 12 months how often did your partner scream or curse at you?: Never    Review of Systems:    Constitutional: No weight loss, fever, chills, weakness or fatigue HEENT: Eyes: No  change in vision               Ears, Nose, Throat:  No change in hearing or congestion Skin: No rash or itching Cardiovascular: No chest pain, chest pressure or palpitations   Respiratory: No SOB or cough Gastrointestinal: See HPI and otherwise negative Genitourinary: No dysuria or change  in urinary frequency Neurological: No headache, dizziness or syncope Musculoskeletal: No new muscle or joint pain Hematologic: No bleeding or bruising Psychiatric: No history of depression or anxiety    Physical Exam:  Vital signs: There were no vitals taken for this visit.  Constitutional: NAD, alert and cooperative Head:  Normocephalic and atraumatic. Eyes:   PEERL, EOMI. No icterus. Conjunctiva pink. Respiratory: Respirations even and unlabored. Lungs clear to auscultation bilaterally.   No wheezes, crackles, or rhonchi.  Cardiovascular:  Regular rate and rhythm. No peripheral edema, cyanosis or pallor.  Gastrointestinal:  Soft, nondistended, nontender. No rebound or guarding. Normal bowel sounds. No appreciable masses or hepatomegaly. Rectal:  Declines Msk:  Symmetrical without gross deformities. Without edema, no deformity or joint abnormality.  Neurologic:  Alert and  oriented x4;  grossly normal neurologically.  Skin:   Dry and intact without significant lesions or rashes. Psychiatric: Oriented to person, place and time. Demonstrates good judgement and reason without abnormal affect or behaviors.  Physical Exam    RELEVANT LABS AND IMAGING: CBC No results found for: "WBC", "RBC", "HGB", "HCT", "PLT", "MCV", "MCH", "MCHC", "RDW", "LYMPHSABS", "MONOABS", "EOSABS", "BASOSABS"  CMP  No results found for: "NA", "K", "CL", "CO2", "GLUCOSE", "BUN", "CREATININE", "CALCIUM", "PROT", "ALBUMIN", "AST", "ALT", "ALKPHOS", "BILITOT", "GFRNONAA", "GFRAA"   Assessment/Plan:   Assessment and Plan Assessment & Plan    History of duodenal ulcer History of duodenal ulcer in the setting of NSAID  and aspirin use with biopsies negative for H. pylori on EGD in 2021 treated with PPI twice daily and Carafate   Screening for colon cancer Colonoscopy 12/2017 with diverticulosis and internal hemorrhoids, repeat 10 years - Due for repeat 12/2026  Suzanna Erp, PA-C [facility] 08/27/2023, 8:45 PM  Cc: Chandler Combs, MD

## 2023-08-28 ENCOUNTER — Ambulatory Visit: Admitting: Gastroenterology

## 2023-08-28 ENCOUNTER — Encounter: Payer: Self-pay | Admitting: Gastroenterology

## 2023-08-28 VITALS — BP 138/76 | HR 60 | Ht 67.0 in | Wt 146.0 lb

## 2023-08-28 DIAGNOSIS — Z8719 Personal history of other diseases of the digestive system: Secondary | ICD-10-CM

## 2023-08-28 DIAGNOSIS — Z1211 Encounter for screening for malignant neoplasm of colon: Secondary | ICD-10-CM

## 2023-08-28 DIAGNOSIS — K6289 Other specified diseases of anus and rectum: Secondary | ICD-10-CM

## 2023-08-28 DIAGNOSIS — K269 Duodenal ulcer, unspecified as acute or chronic, without hemorrhage or perforation: Secondary | ICD-10-CM

## 2023-08-28 NOTE — Patient Instructions (Signed)
 _______________________________________________________  If your blood pressure at your visit was 140/90 or greater, please contact your primary care physician to follow up on this. _______________________________________________________  If you are age 75 or older, your body mass index should be between 23-30. Your Body mass index is 22.87 kg/m. If this is out of the aforementioned range listed, please consider follow up with your Primary Care Provider. ________________________________________________________  The [place] providers would like to encourage you to use MYCHART to communicate with providers for non-urgent requests or questions.  Due to long hold times on the telephone, sending your provider a message by [facility] may be a faster and more efficient way to get a response.  Please allow 48 business hours for a response.  Please remember that this is for non-urgent requests.  _______________________________________________________  Thank you for entrusting me with your care and choosing [facility].  [place], PA-C

## 2023-08-29 NOTE — Progress Notes (Signed)
 Agree with assessment and plan as outlined.

## 2023-10-18 ENCOUNTER — Ambulatory Visit
Admission: RE | Admit: 2023-10-18 | Discharge: 2023-10-18 | Disposition: A | Discharge status: Home / Self Care | Source: Ambulatory Visit | Attending: Family Medicine | Admitting: Family Medicine

## 2023-10-18 DIAGNOSIS — Z1231 Encounter for screening mammogram for malignant neoplasm of breast: Secondary | ICD-10-CM

## 2024-01-30 ENCOUNTER — Other Ambulatory Visit: Payer: Self-pay

## 2024-01-30 ENCOUNTER — Encounter (HOSPITAL_COMMUNITY): Payer: Self-pay | Admitting: Obstetrics and Gynecology

## 2024-01-30 NOTE — Progress Notes (Signed)
 SDW CALL  Patient was given pre-op instructions over the phone. The opportunity was given for the patient to ask questions. No further questions asked. Patient verbalized understanding of instructions given.   PCP - Aldona Pizza, NP Cardiologist - denies  PPM/ICD - denies   Chest x-ray - denies EKG - DOS Stress Test - denies ECHO - denies Cardiac Cath - denies  Sleep Study - denies  No DM  Last dose of GLP1 agonist-  n/a GLP1 instructions:  n/a  Blood Thinner Instructions: n/a Aspirin Instructions: n/a  ERAS Protcol - NPO   COVID TEST- n/a   Anesthesia review: no  Patient denies shortness of breath, fever, cough and chest pain over the phone call   All instructions explained to the patient, with a verbal understanding of the material. Patient agrees to go over the instructions while at home for a better understanding.

## 2024-01-31 ENCOUNTER — Other Ambulatory Visit: Payer: Self-pay | Admitting: Obstetrics and Gynecology

## 2024-01-31 DIAGNOSIS — Z01818 Encounter for other preprocedural examination: Secondary | ICD-10-CM

## 2024-01-31 NOTE — H&P (Signed)
 73 y.o. complains of postmenopausal bleeding.  Previously: GYN scan today for light vaginal bleeding, noticed only when wiping. see pt case; no urinary sxs, but reports some pain in her back and legs. pt left urine sample, +le/+blood, sent for culture. -SH//  US  last done 2019. Light vaginal bleeding started about Oct 1.  Some pain in back and legs. Culture of urine done.   Physician interpretation: 4x3x3; EM 0.68mm. Prominent wall of cervix. Normal ovaries and no additional masses./mahmd  Post emb in 2019: PMP bleeding persists. Now stopped with stopping HRT. EMB done and CA 125 normal. Will see if pt needs to go back on HRT for sx after path back.  Path was benign endo. Not estrogen cream in 6 months, really worked well and was only going to use when needed.   Pap 02-2022 was normal, no prior abnormal paps.  Past Medical History:  Diagnosis Date   Allergy    Arthritis    Diverticula of colon    Duodenal ulcer    GERD (gastroesophageal reflux disease)    Hemorrhoids    internal and external   Hyperlipidemia    Hypertension    Past Surgical History:  Procedure Laterality Date   Bladder Stretched     COLONOSCOPY     HEMORRHOID BANDING     TUBAL LIGATION Bilateral    UPPER GI ENDOSCOPY     WISDOM TOOTH EXTRACTION      Social History   Socioeconomic History   Marital status: Married    Spouse name: Not on file   Number of children: 2   Years of education: Not on file   Highest education level: Not on file  Occupational History   Occupation: retired  Tobacco Use   Smoking status: Never   Smokeless tobacco: Never  Vaping Use   Vaping status: Never Used  Substance and Sexual Activity   Alcohol use: No   Drug use: No   Sexual activity: Not on file  Other Topics Concern   Not on file  Social History Narrative   Not on file   Social Drivers of Health   Financial Resource Strain: Low Risk  (08/03/2023)   Received from [facility]   Overall Financial Resource  Strain (CARDIA)    Difficulty of Paying Living Expenses: Not hard at all  Food Insecurity: No Food Insecurity (08/03/2023)   Received from [facility]   Hunger Vital Sign    Within the past 12 months, you worried that your food would run out before you got the money to buy more.: Never true    Within the past 12 months, the food you bought just didn't last and you didn't have money to get more.: Never true  Transportation Needs: No Transportation Needs (08/03/2023)   Received from [facility] - Transportation    Lack of Transportation (Medical): No    Lack of Transportation (Non-Medical): No  Physical Activity: Unknown (08/03/2023)   Received from [facility]   Exercise Vital Sign    On average, how many days per week do you engage in moderate to strenuous exercise (like a brisk walk)?: 0 days    Minutes of Exercise per Session: Not on file  Stress: No Stress Concern Present (08/03/2023)   Received from [facility] - Occupational Stress Questionnaire    Feeling of Stress : Not at all  Social Connections: Socially Integrated (08/03/2023)   Received [facility]  Social Network    How would you rate your social network (family, work, friends)?: Good participation with social networks  Intimate Partner Violence: Not At Risk (08/03/2023)   Received from [facility]   HITS    Over the last 12 months how often did your partner physically hurt you?: Never    Over the last 12 months how often did your partner insult you or talk down to you?: Never    Over the last 12 months how often did your partner threaten you with physical harm?: Never    Over the last 12 months how often did your partner scream or curse at you?: Never    No current facility-administered medications on file prior to encounter.   Current Outpatient Medications on File Prior to Encounter  Medication Sig Dispense Refill   acetaminophen (TYLENOL) 500 MG  tablet Take 1,000 mg by mouth every 6 (six) hours as needed for mild pain (pain score 1-3) or moderate pain (pain score 4-6).     atorvastatin (LIPITOR) 10 MG tablet Take 5 mg by mouth daily.     AZO-CRANBERRY PO Take 2 capsules by mouth daily.     Carboxymeth-Glyc-Polysorb PF (REFRESH OPTIVE MEGA-3) 0.5-1-0.5 % SOLN Place 1 drop into both ears daily as needed (Dry eyes).     Carboxymethylcellulose Sod PF (REFRESH PLUS) 0.5 % SOLN Place 1 drop into both eyes at bedtime. Rub on eye lids     cetirizine (ZYRTEC) 10 MG tablet Take 10 mg by mouth daily.     cholecalciferol (VITAMIN D3) 25 MCG (1000 UNIT) tablet Take 1,000 Units by mouth daily.     Coenzyme Q10 (COQ-10 PO) Take 300 mg by mouth daily.     EPINEPHrine 0.3 mg/0.3 mL IJ SOAJ injection Inject 1 mg into the muscle once.     estradiol (ESTRACE) 0.01 % CREA vaginal cream Place 1 Applicatorful vaginally daily as needed.     hydrochlorothiazide (HYDRODIURIL) 25 MG tablet Take 25 mg by mouth daily.     metoprolol succinate (TOPROL-XL) 25 MG 24 hr tablet Take 25 mg by mouth daily.     montelukast (SINGULAIR) 10 MG tablet Take 10 mg by mouth at bedtime.     Probiotic Product (PROBIOTIC-10 ULTIMATE PO) Take 10 mg by mouth daily.     sodium chloride  (OCEAN) 0.65 % SOLN nasal spray Place 2 sprays into both nostrils daily.     dicyclomine  (BENTYL ) 10 MG capsule TAKE 1 CAPSULE BY MOUTH EVERY 8 HOURS AS NEEDED FOR  SPASMS (Patient not taking: Reported on 08/28/2023) 30 capsule 0    Allergies  Allergen Reactions   Nsaids     Other reaction(s): Bleeding   Bee Venom Hives   Penicillins Rash   Sulfa Antibiotics Rash    There were no vitals filed for this visit.  Lungs: clear to ascultation Cor:  RRR Abdomen:  soft, nontender, nondistended. Ex:  no cords, erythema Pelvic:   Vulva: no masses, no atrophy, no lesions Vagina: no tenderness, no erythema, no abnormal vaginal discharge, no vesicle(s) or ulcers, no cystocele, no rectocele Cervix:  grossly normal, no discharge, no cervical motion tenderness Bladder/Urethra: normal meatus, no urethral discharge, no urethral mass, bladder non distended, Urethra well supported   A:  For D&C, hysteroscopy.    P: P: All risks, benefits and alternatives d/w patient and she desires to proceed.  Patient has undergone an ERAS protocol and will receive SCDs during the operation.    Gloria Moore

## 2024-02-01 ENCOUNTER — Ambulatory Visit (HOSPITAL_COMMUNITY)
Admission: RE | Admit: 2024-02-01 | Discharge: 2024-02-01 | Disposition: A | Discharge status: Home / Self Care | Attending: Obstetrics and Gynecology | Admitting: Obstetrics and Gynecology

## 2024-02-01 ENCOUNTER — Ambulatory Visit (HOSPITAL_COMMUNITY)

## 2024-02-01 ENCOUNTER — Encounter (HOSPITAL_COMMUNITY)
Admission: RE | Disposition: A | Discharge status: Home / Self Care | Payer: Self-pay | Source: Home / Self Care | Attending: Obstetrics and Gynecology

## 2024-02-01 ENCOUNTER — Encounter (HOSPITAL_COMMUNITY): Payer: Self-pay | Admitting: Obstetrics and Gynecology

## 2024-02-01 ENCOUNTER — Ambulatory Visit (HOSPITAL_BASED_OUTPATIENT_CLINIC_OR_DEPARTMENT_OTHER)

## 2024-02-01 DIAGNOSIS — N950 Postmenopausal bleeding: Secondary | ICD-10-CM

## 2024-02-01 DIAGNOSIS — Z79899 Other long term (current) drug therapy: Secondary | ICD-10-CM | POA: Diagnosis not present

## 2024-02-01 DIAGNOSIS — Z01818 Encounter for other preprocedural examination: Secondary | ICD-10-CM

## 2024-02-01 DIAGNOSIS — I10 Essential (primary) hypertension: Secondary | ICD-10-CM | POA: Diagnosis not present

## 2024-02-01 HISTORY — PX: DILATATION & CURRETTAGE/HYSTEROSCOPY WITH RESECTOCOPE: SHX5572

## 2024-02-01 LAB — TYPE AND SCREEN
ABO/RH(D): O POS
Antibody Screen: NEGATIVE

## 2024-02-01 LAB — CBC
HCT: 41.8 % (ref 36.0–46.0)
Hemoglobin: 13.6 g/dL (ref 12.0–15.0)
MCH: 31.6 pg (ref 26.0–34.0)
MCHC: 32.5 g/dL (ref 30.0–36.0)
MCV: 97 fL (ref 80.0–100.0)
Platelets: 187 K/uL (ref 150–400)
RBC: 4.31 MIL/uL (ref 3.87–5.11)
RDW: 12.7 % (ref 11.5–15.5)
WBC: 8.6 K/uL (ref 4.0–10.5)
nRBC: 0 % (ref 0.0–0.2)

## 2024-02-01 LAB — ABO/RH: ABO/RH(D): O POS

## 2024-02-01 SURGERY — DILATATION & CURETTAGE/HYSTEROSCOPY WITH RESECTOCOPE
Anesthesia: General | Site: Uterus

## 2024-02-01 MED ORDER — KETOROLAC TROMETHAMINE 30 MG/ML IJ SOLN
INTRAMUSCULAR | Status: AC
  Filled 2024-02-01: qty 1

## 2024-02-01 MED ORDER — FENTANYL CITRATE (PF) 100 MCG/2ML IJ SOLN
INTRAMUSCULAR | Status: AC
  Filled 2024-02-01: qty 2

## 2024-02-01 MED ORDER — DROPERIDOL 2.5 MG/ML IJ SOLN: 0.6250 mg | Freq: Once | INTRAMUSCULAR | Status: DC | PRN

## 2024-02-01 MED ORDER — FENTANYL CITRATE (PF) 250 MCG/5ML IJ SOLN
INTRAMUSCULAR | Status: DC | PRN
  Administered 2024-02-01: 25 ug via INTRAVENOUS

## 2024-02-01 MED ORDER — SODIUM CHLORIDE 0.9 % IR SOLN
Status: DC | PRN
  Administered 2024-02-01: 3000 mL

## 2024-02-01 MED ORDER — LACTATED RINGERS IV SOLN: INTRAVENOUS | Status: DC

## 2024-02-01 MED ORDER — PROPOFOL 10 MG/ML IV BOLUS
INTRAVENOUS | Status: DC | PRN
  Administered 2024-02-01: 40 mg via INTRAVENOUS
  Administered 2024-02-01: 120 mg via INTRAVENOUS

## 2024-02-01 MED ORDER — LIDOCAINE 2% (20 MG/ML) 5 ML SYRINGE
INTRAMUSCULAR | Status: AC
  Filled 2024-02-01: qty 5

## 2024-02-01 MED ORDER — POVIDONE-IODINE 10 % EX SWAB: 2.0000 | Freq: Once | CUTANEOUS | Status: DC

## 2024-02-01 MED ORDER — CHLORHEXIDINE GLUCONATE 0.12 % MT SOLN
15.0000 mL | Freq: Once | OROMUCOSAL | Status: AC
  Administered 2024-02-01: 15 mL via OROMUCOSAL
  Filled 2024-02-01: qty 15

## 2024-02-01 MED ORDER — LIDOCAINE 2% (20 MG/ML) 5 ML SYRINGE
INTRAMUSCULAR | Status: DC | PRN
  Administered 2024-02-01: 60 mg via INTRAVENOUS

## 2024-02-01 MED ORDER — PROPOFOL 10 MG/ML IV BOLUS
INTRAVENOUS | Status: AC
Start: 2024-02-01 — End: 2024-02-01
  Filled 2024-02-01: qty 20

## 2024-02-01 MED ORDER — FENTANYL CITRATE (PF) 100 MCG/2ML IJ SOLN
25.0000 ug | INTRAMUSCULAR | Status: DC | PRN
  Administered 2024-02-01 (×2): 25 ug via INTRAVENOUS

## 2024-02-01 MED ORDER — SOD CITRATE-CITRIC ACID 500-334 MG/5ML PO SOLN: 30.0000 mL | ORAL | Status: DC

## 2024-02-01 MED ORDER — DEXAMETHASONE SOD PHOSPHATE PF 10 MG/ML IJ SOLN
INTRAMUSCULAR | Status: DC | PRN
  Administered 2024-02-01: 10 mg via INTRAVENOUS

## 2024-02-01 MED ORDER — ONDANSETRON HCL 4 MG/2ML IJ SOLN
INTRAMUSCULAR | Status: AC
  Filled 2024-02-01: qty 2

## 2024-02-01 MED ORDER — OXYCODONE HCL 5 MG PO TABS: 5.0000 mg | ORAL_TABLET | Freq: Once | ORAL | Status: DC | PRN

## 2024-02-01 MED ORDER — ORAL CARE MOUTH RINSE: 15.0000 mL | Freq: Once | OROMUCOSAL | Status: AC

## 2024-02-01 MED ORDER — ONDANSETRON HCL 4 MG/2ML IJ SOLN
INTRAMUSCULAR | Status: DC | PRN
  Administered 2024-02-01: 4 mg via INTRAVENOUS

## 2024-02-01 MED ORDER — OXYCODONE HCL 5 MG/5ML PO SOLN: 5.0000 mg | Freq: Once | ORAL | Status: DC | PRN

## 2024-02-01 MED ORDER — TRAMADOL HCL 50 MG PO TABS: 50.0000 mg | ORAL_TABLET | Freq: Four times a day (QID) | ORAL | 0 refills | Status: AC | PRN

## 2024-02-01 MED ORDER — PROPOFOL 10 MG/ML IV BOLUS
INTRAVENOUS | Status: AC
  Filled 2024-02-01: qty 20

## 2024-02-01 MED ORDER — ONDANSETRON HCL 4 MG/2ML IJ SOLN: 4.0000 mg | Freq: Once | INTRAMUSCULAR | Status: DC | PRN

## 2024-02-01 MED ORDER — ACETAMINOPHEN 10 MG/ML IV SOLN: 1000.0000 mg | Freq: Once | INTRAVENOUS | Status: DC | PRN

## 2024-02-01 SURGICAL SUPPLY — 10 items
CATH ROBINSON RED A/P 16FR (CATHETERS) ×1 IMPLANT
GLOVE BIO SURGEON STRL SZ7 (GLOVE) ×1 IMPLANT
GOWN STRL REUS W/ TWL XL LVL3 (GOWN DISPOSABLE) ×1 IMPLANT
KIT PROCED FLUENT PRO FLT212S (KITS) ×1 IMPLANT
KIT TURNOVER KIT B (KITS) ×1 IMPLANT
PACK VAGINAL MINOR WOMEN LF (CUSTOM PROCEDURE TRAY) ×1 IMPLANT
PAD OB MATERNITY 11 LF (PERSONAL CARE ITEMS) ×1 IMPLANT
SEAL ROD LENS SCOPE MYOSURE (ABLATOR) ×1 IMPLANT
SLEEVE SCD COMPRESS KNEE MED (STOCKING) ×1 IMPLANT
TOWEL GREEN STERILE (TOWEL DISPOSABLE) ×1 IMPLANT

## 2024-02-01 NOTE — Anesthesia Postprocedure Evaluation (Signed)
 Anesthesia Post Note  Patient: Gloria Moore  Procedure(s) Performed: DILATATION & CURETTAGE/HYSTEROSCOPY WITH RESECTOCOPE (Uterus)     Patient location during evaluation: PACU Anesthesia Type: General Level of consciousness: awake Pain management: pain level controlled Vital Signs Assessment: post-procedure vital signs reviewed and stable Respiratory status: spontaneous breathing Cardiovascular status: blood pressure returned to baseline Postop Assessment: no apparent nausea or vomiting Anesthetic complications: no   No notable events documented.  Last Vitals:  Vitals:   02/01/24 0900 02/01/24 0915  BP: (!) 127/56 124/65  Pulse: (!) 47 (!) 49  Resp: 11 15  Temp:  36.4 C  SpO2: 96% 100%    Last Pain:  Vitals:   02/01/24 0815  TempSrc:   PainSc: 0-No pain                 Lauraine KATHEE [place]

## 2024-02-01 NOTE — Progress Notes (Signed)
 There has been no change in the patients history, status or exam since the history and physical.  Vitals:   02/01/24 0629  BP: (!) 163/81  Pulse: (!) 55  Resp: 17  Temp: 98.5 F (36.9 C)  TempSrc: Oral  SpO2: 100%  Weight: 64 kg  Height: 5' 7 (1.702 m)    No results found for this or any previous visit (from the past 72 hours).  Gloria Moore

## 2024-02-01 NOTE — Brief Op Note (Signed)
 02/01/2024  8:02 AM  PATIENT:  Gloria Moore  73 y.o. female  PRE-OPERATIVE DIAGNOSIS:  post menopausal bleeding  POST-OPERATIVE DIAGNOSIS:  post menopausal bleeding  PROCEDURE:  Procedure(s): DILATATION & CURETTAGE/HYSTEROSCOPY WITH RESECTOCOPE (N/A)  ANESTHESIA:   general  EBL:  <50  BLOOD ADMINISTERED:none  Hysteroscopy deficit: 745 cc  DRAINS: none   LOCAL MEDICATIONS USED:  NONE  SPECIMEN:  Source of Specimen:  uterine currettings  DISPOSITION OF SPECIMEN:  PATHOLOGY  COUNTS:  YES  TOURNIQUET:  * No tourniquets in log *  DICTATION: .Note written in EPIC  PLAN OF CARE: Discharge to home after PACU  PATIENT DISPOSITION:  PACU - hemodynamically stable.   Delay start of Pharmacological VTE agent (>24hrs) due to surgical blood loss or risk of bleeding: not applicable

## 2024-02-01 NOTE — H&P (View-Only) (Signed)
 There has been no change in the patients history, status or exam since the history and physical.  Vitals:   02/01/24 0629  BP: (!) 163/81  Pulse: (!) 55  Resp: 17  Temp: 98.5 F (36.9 C)  TempSrc: Oral  SpO2: 100%  Weight: 64 kg  Height: 5' 7 (1.702 m)    No results found for this or any previous visit (from the past 72 hours).  Gloria Moore

## 2024-02-01 NOTE — Anesthesia Preprocedure Evaluation (Addendum)
 Anesthesia Evaluation  Patient identified by MRN, date of birth, ID band Patient awake    Reviewed: Allergy & Precautions, NPO status , Patient's Chart, lab work & pertinent test results  History of Anesthesia Complications Negative for: history of anesthetic complications  Airway Mallampati: II       Dental  (+) Teeth Intact, Dental Advisory Given   Pulmonary neg COPD, neg recent URI   breath sounds clear to auscultation       Cardiovascular hypertension, Pt. on medications  Rhythm:Regular Rate:Normal     Neuro/Psych    GI/Hepatic hiatal hernia, PUD,GERD  Medicated,,  Endo/Other  neg diabetes    Renal/GU Renal disease     Musculoskeletal   Abdominal   Peds  Hematology   Anesthesia Other Findings Chronic thoracic back pain  Reproductive/Obstetrics                              Anesthesia Physical Anesthesia Plan  ASA: 2  Anesthesia Plan: General   Post-op Pain Management:    Induction: Intravenous  PONV Risk Score and Plan: 1 and Ondansetron, Dexamethasone and Treatment may vary due to age or medical condition  Airway Management Planned: LMA and Oral ETT  Additional Equipment:   Intra-op Plan:   Post-operative Plan: Extubation in OR  Informed Consent:      Dental advisory given  Plan Discussed with: CRNA and Surgeon  Anesthesia Plan Comments:          Anesthesia Quick Evaluation

## 2024-02-01 NOTE — Interval H&P Note (Signed)
 History and Physical Interval Note:  02/01/2024 7:16 AM  Gloria Moore  has presented today for surgery, with the diagnosis of post menopausal bleeding.  The various methods of treatment have been discussed with the patient and family. After consideration of risks, benefits and other options for treatment, the patient has consented to  Procedure(s): DILATATION & CURETTAGE/HYSTEROSCOPY WITH RESECTOCOPE (N/A) as a surgical intervention.  The patient's history has been reviewed, patient examined, no change in status, stable for surgery.  I have reviewed the patient's chart and labs.  Questions were answered to the patient's satisfaction.     Gloria Moore

## 2024-02-01 NOTE — Transfer of Care (Signed)
 Immediate Anesthesia Transfer of Care Note  Patient: Gloria Moore  Procedure(s) Performed: DILATATION & CURETTAGE/HYSTEROSCOPY WITH RESECTOCOPE  Patient Location: PACU  Anesthesia Type:General  Level of Consciousness: awake, alert , patient cooperative, and responds to stimulation  Airway & Oxygen Therapy: Patient Spontanous Breathing and Patient connected to face mask oxygen  Post-op Assessment: Report given to RN and Post -op Vital signs reviewed and stable  Post vital signs: Reviewed and stable  Last Vitals:  Vitals Value Taken Time  BP 131/68 02/01/24 08:15  Temp    Pulse 54 02/01/24 08:15  Resp 5 02/01/24 08:15  SpO2 100 % 02/01/24 08:15  Vitals shown include unfiled device data.  Last Pain:  Vitals:   02/01/24 0629  TempSrc: Oral  PainSc: 0-No pain      Patients Stated Pain Goal: 5 (02/01/24 0629)  Complications: No notable events documented.

## 2024-02-01 NOTE — Anesthesia Procedure Notes (Signed)
 Procedure Name: LMA Insertion Date/Time: 02/01/2024 7:41 AM  Performed by: Jolynn Mage, CRNAPre-anesthesia Checklist: Patient identified, Emergency Drugs available, Suction available and Patient being monitored Patient Re-evaluated:Patient Re-evaluated prior to induction Oxygen Delivery Method: Circle system utilized Preoxygenation: Pre-oxygenation with 100% oxygen Induction Type: IV induction Ventilation: Mask ventilation without difficulty LMA: LMA flexible inserted LMA Size: 4.0 Number of attempts: 1 Placement Confirmation: positive ETCO2 and breath sounds checked- equal and bilateral Tube secured with: Tape Dental Injury: Teeth and Oropharynx as per pre-operative assessment

## 2024-02-01 NOTE — Op Note (Signed)
 02/01/2024  8:02 AM  PATIENT:  Gloria Moore  73 y.o. female  PRE-OPERATIVE DIAGNOSIS:  post menopausal bleeding  POST-OPERATIVE DIAGNOSIS:  post menopausal bleeding  PROCEDURE:  Procedure(s): DILATATION & CURETTAGE/HYSTEROSCOPY WITH RESECTOCOPE (N/A)  ANESTHESIA:   general  EBL:  <50  BLOOD ADMINISTERED:none  Hysteroscopy deficit: 745 cc  DRAINS: none   LOCAL MEDICATIONS USED:  NONE  SPECIMEN:  Source of Specimen:  uterine currettings  DISPOSITION OF SPECIMEN:  PATHOLOGY  COUNTS:  YES  TOURNIQUET:  * No tourniquets in log *  DICTATION: .Note written in EPIC  PLAN OF CARE: Discharge to home after PACU  PATIENT DISPOSITION:  PACU - hemodynamically stable.   Delay start of Pharmacological VTE agent (>24hrs) due to surgical blood loss or risk of bleeding: not applicable  Complications: Uterine perforation.  Medications: none  Technique:  After adequate anesthesia was achieved, the patient was prepped and draped in the usual sterile fashion.  The speculum was placed in the vagina and the cervix stabilized with a single-tooth tenaculum.  The cervix was dilated with pratt dilators and the hysteroscope passed inside the endometrial cavity.  The above findings were noted and there was a stable non-blleeding perforation in the R cornua.  The currette was gently advanced to just above the os and sharp curettage was then performed carefully and uterine curettings sent to path.  Another look revealed stable perforation and cavity was now shaggy.  We spent a few minutes watching the cervix after instruments were removed from the uterus to make sure there was only very scant bleeding from the os.  Once hemostasis was confirmed, all instruments were removed from the vagina.  The patient tolerated the procedure well.    Aundra Espin A

## 2024-02-02 ENCOUNTER — Encounter (HOSPITAL_COMMUNITY): Payer: Self-pay | Admitting: Obstetrics and Gynecology

## 2024-02-02 LAB — SURGICAL PATHOLOGY

## 2024-03-06 ENCOUNTER — Telehealth: Payer: Self-pay

## 2024-03-06 DIAGNOSIS — M8580 Other specified disorders of bone density and structure, unspecified site: Secondary | ICD-10-CM

## 2024-03-06 NOTE — Telephone Encounter (Signed)
 Called and Left detailed message for patient with Dr. Hassan remarks.  Will put in order in case she still want to have done downstairs.

## 2024-03-06 NOTE — Telephone Encounter (Signed)
 Patient came into the practice today with an order of a Dexa Scan from [facility] wanting Dr. Leigh to sign it so she can have it done downstairs in our Radiology Dept.  Patient was notified that we cannot sign orders for an outside health system and would need to place an internal order.  Patient is requesting that Nestor Blower, whom she saw in May of 2025, or Dr. Leigh, put in order for bone density scan downstairs she she can have it today if possible.  Please advise.  Thank you

## 2024-03-06 NOTE — Telephone Encounter (Signed)
 Jan can you let the patient know that while we do sometimes do DEXA scans for patients, for routine osteopenia screening this normally goes through primary care.  If she prefers to do it at our facility I can order it for her, and she can do it here, however it is often not a same-day test, only occasionally we can do that, and it would need to be scheduled for her and any recommendations on management would come from her primary care, not us .  Up to her where she wants to have it done.  Can you let her know in the future if she wants to pursue something like that she needs to call us  in advance so she would not have to wait out in the waiting room.

## 2024-03-13 ENCOUNTER — Ambulatory Visit
Admission: RE | Admit: 2024-03-13 | Discharge: 2024-03-13 | Disposition: A | Source: Ambulatory Visit | Attending: Gastroenterology

## 2024-03-13 DIAGNOSIS — M8580 Other specified disorders of bone density and structure, unspecified site: Secondary | ICD-10-CM

## 2024-03-15 ENCOUNTER — Ambulatory Visit: Payer: Self-pay | Admitting: Gastroenterology
